# Patient Record
Sex: Female | Born: 1990 | Race: White | Hispanic: No | Marital: Single | Smoking: Current every day smoker
Health system: Southern US, Community
[De-identification: ages and names within clinical notes are randomized; demographics above are authoritative.]

## PROBLEM LIST (undated history)

## (undated) DIAGNOSIS — F99 Mental disorder, not otherwise specified: Secondary | ICD-10-CM

## (undated) DIAGNOSIS — S72302A Unspecified fracture of shaft of left femur, initial encounter for closed fracture: Secondary | ICD-10-CM

## (undated) DIAGNOSIS — F1120 Opioid dependence, uncomplicated: Secondary | ICD-10-CM

## (undated) DIAGNOSIS — E669 Obesity, unspecified: Secondary | ICD-10-CM

## (undated) DIAGNOSIS — K589 Irritable bowel syndrome without diarrhea: Secondary | ICD-10-CM

## (undated) DIAGNOSIS — F1110 Opioid abuse, uncomplicated: Secondary | ICD-10-CM

## (undated) DIAGNOSIS — D693 Immune thrombocytopenic purpura: Secondary | ICD-10-CM

## (undated) DIAGNOSIS — F1910 Other psychoactive substance abuse, uncomplicated: Secondary | ICD-10-CM

## (undated) HISTORY — DX: Immune thrombocytopenic purpura: D69.3

## (undated) HISTORY — PX: WISDOM TOOTH EXTRACTION: SHX21

## (undated) HISTORY — DX: Obesity, unspecified: E66.9

---
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
## 2010-03-16 HISTORY — PX: COLONOSCOPY: SHX174

## 2012-09-09 ENCOUNTER — Encounter (HOSPITAL_COMMUNITY): Payer: Self-pay | Admitting: *Deleted

## 2012-09-09 ENCOUNTER — Ambulatory Visit (HOSPITAL_COMMUNITY)
Admission: RE | Admit: 2012-09-09 | Discharge: 2012-09-09 | Disposition: A | Discharge status: Home / Self Care | Payer: BC Managed Care – PPO | Attending: Psychiatry | Admitting: Psychiatry

## 2012-09-09 DIAGNOSIS — F1910 Other psychoactive substance abuse, uncomplicated: Secondary | ICD-10-CM | POA: Insufficient documentation

## 2012-09-09 HISTORY — DX: Mental disorder, not otherwise specified: F99

## 2012-09-09 HISTORY — DX: Irritable bowel syndrome, unspecified: K58.9
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
## 2012-09-10 ENCOUNTER — Encounter (HOSPITAL_COMMUNITY): Payer: Self-pay | Admitting: Psychology

## 2012-09-11 ENCOUNTER — Other Ambulatory Visit (HOSPITAL_COMMUNITY): Payer: BC Managed Care – PPO | Attending: Psychiatry | Admitting: Psychology

## 2012-09-11 DIAGNOSIS — F1910 Other psychoactive substance abuse, uncomplicated: Secondary | ICD-10-CM | POA: Insufficient documentation

## 2012-09-11 DIAGNOSIS — F1120 Opioid dependence, uncomplicated: Secondary | ICD-10-CM

## 2012-09-11 DIAGNOSIS — F1320 Sedative, hypnotic or anxiolytic dependence, uncomplicated: Secondary | ICD-10-CM

## 2012-09-12 ENCOUNTER — Encounter (HOSPITAL_COMMUNITY): Payer: Self-pay | Admitting: Psychology

## 2012-09-12 DIAGNOSIS — F1120 Opioid dependence, uncomplicated: Secondary | ICD-10-CM | POA: Insufficient documentation

## 2012-09-12 DIAGNOSIS — F1320 Sedative, hypnotic or anxiolytic dependence, uncomplicated: Secondary | ICD-10-CM | POA: Insufficient documentation
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
## 2012-09-13 ENCOUNTER — Other Ambulatory Visit (HOSPITAL_COMMUNITY): Payer: BC Managed Care – PPO

## 2012-09-15 ENCOUNTER — Telehealth (HOSPITAL_COMMUNITY): Payer: Self-pay | Admitting: Psychology

## 2012-09-15 NOTE — Progress Notes (Unsigned)
Patient ID: Jodi Wilson, female   DOB: 1990/10/08, 22 y.o.   MRN: [id number] CD-IOP: The patient did not appear for group today nor did she contact me to explain her absence. She appeared on Wednesday for her first group session. At the conclusion of group, I phoned her mother and inquired about her daughter and explained her absence from group. The patient's mother reported her daughter had just come home a little earlier and reported she had gone to group. She became angry and reported she knew exactly where she was and suspected she had been using. [name] reported her daughter would be in group on Monday if she had to come with her. We will discuss this case with the medical director and co--therapist and determine what we should do with this young woman. Her absence will be recorded as unexcused.

## 2012-09-16 ENCOUNTER — Other Ambulatory Visit (HOSPITAL_COMMUNITY): Payer: BC Managed Care – PPO | Attending: Psychiatry | Admitting: Psychology

## 2012-09-16 DIAGNOSIS — F1910 Other psychoactive substance abuse, uncomplicated: Secondary | ICD-10-CM | POA: Insufficient documentation

## 2012-09-16 DIAGNOSIS — F1320 Sedative, hypnotic or anxiolytic dependence, uncomplicated: Secondary | ICD-10-CM

## 2012-09-16 DIAGNOSIS — F1120 Opioid dependence, uncomplicated: Secondary | ICD-10-CM

## 2012-09-17 ENCOUNTER — Encounter (HOSPITAL_COMMUNITY): Payer: Self-pay | Admitting: Psychology

## 2012-09-17 NOTE — Progress Notes (Signed)
    Daily Group Progress Note  Program: CD-IOP   Group Time: 1-2:30 pm  Participation Level: Minimal  Behavioral Response: Sharing  Type of Therapy: Psycho-education Group  Topic: Group Process/Mind/Body/Spirit: the group began with check-in from the weekend. Members shared what they had done to support their recovery. Also discussed were obstacles or problems that may have appeared since we last met. A handout was provided to all group members titled, "Balance of Mind, Body, and Spirit". members were asked to identify what they do to balance and feed each part of themselves. Each member came up and wrote down what they do in each area. The importance of finding balance was emphasized.   Group Time: 2:45- 4pm  Participation Level: Active  Behavioral Response: Sharing  Type of Therapy: Activity Group  Topic: Family Sculpture: the second half of group was spent in an activity called "Family Sculpture". Members were asked to sculpt their 'biological family'. this included using other group members to represent each of their family members in a scene reflective of their family life when they were children. The first sculpture proved very interesting and painful as the female member sculpted a critical, argumentative, scene with her family. There was good discussion and feedback among group members.   Summary: The patient reported she had spent the weekend with family and friends. When I asked her where she had been on Friday, she reported she hadn't felt well, but didn't tell her mother and just went to a friend's house. I reminded this patient that she had lied to her mother about a number of things and I questioned whether she had used over the weekend? The patient reported she had not used and her sobriety date remained 7/30. At the break the patient spoke to me privately and reported she had been embarrassed and didn't want to tell the group that she had taken a Xanax and Vicodin on Sunday  because she was anxious and aching from the opiate withdrawal. I insisted she share this with the group and she did after the group returned. Group members all noted that they had also suffered anxiety and discomfort in early recovery and she could expect this. The patient was instructed to contact one of the female group members and practice calling people. She also stated she was going to a meeting later tonight. She didn't drive and her mother was going to pick her up and take her. The patient's sobriety date is today - 8/4.   Family Program: Family present? No   Name of family member(s):   UDS collected: Yes Results: not yet returned, but patient admitted taking vicodin and a Xanax yesterday  AA/NA attended?: No, patient has not attended any meetings yet  Sponsor?: No   Bertrand Vowels, LCAS

## 2012-09-18 ENCOUNTER — Other Ambulatory Visit (HOSPITAL_COMMUNITY): Payer: BC Managed Care – PPO | Attending: Psychiatry | Admitting: Psychology

## 2012-09-18 DIAGNOSIS — F1120 Opioid dependence, uncomplicated: Secondary | ICD-10-CM

## 2012-09-18 DIAGNOSIS — F1320 Sedative, hypnotic or anxiolytic dependence, uncomplicated: Secondary | ICD-10-CM

## 2012-09-18 DIAGNOSIS — F1910 Other psychoactive substance abuse, uncomplicated: Secondary | ICD-10-CM | POA: Insufficient documentation

## 2012-09-19 ENCOUNTER — Encounter (HOSPITAL_COMMUNITY): Payer: Self-pay | Admitting: Psychology
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
## 2012-09-20 ENCOUNTER — Other Ambulatory Visit (HOSPITAL_COMMUNITY): Payer: BC Managed Care – PPO | Attending: Psychiatry | Admitting: Psychology

## 2012-09-20 DIAGNOSIS — F1910 Other psychoactive substance abuse, uncomplicated: Secondary | ICD-10-CM | POA: Insufficient documentation

## 2012-09-20 DIAGNOSIS — F1320 Sedative, hypnotic or anxiolytic dependence, uncomplicated: Secondary | ICD-10-CM

## 2012-09-20 DIAGNOSIS — F1120 Opioid dependence, uncomplicated: Secondary | ICD-10-CM

## 2012-09-20 DIAGNOSIS — F1220 Cannabis dependence, uncomplicated: Secondary | ICD-10-CM

## 2012-09-20 MED ORDER — HYDROXYZINE HCL 25 MG PO TABS: 25.0000 mg | ORAL_TABLET | Freq: Every evening | ORAL | Status: DC | PRN
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
## 2012-09-22 ENCOUNTER — Encounter (HOSPITAL_COMMUNITY): Payer: Self-pay | Admitting: Psychology

## 2012-09-22 DIAGNOSIS — F1220 Cannabis dependence, uncomplicated: Secondary | ICD-10-CM | POA: Insufficient documentation

## 2012-09-22 NOTE — Progress Notes (Addendum)
    Daily Group Progress Note  Program: CD-IOP   Group Time: 1-2:30 pm  Participation Level: Minimal  Behavioral Response: Sharing  Type of Therapy: Process Group  Topic: Group Process: the first part of group was spent in process. Members shared bout current issues and concerns. The newest group member vented and disclosed many of his feelings felt in the past 45 hours or since he left this group session on Wednesday. During the time the medical director met with another new member. A brief psycho-ed piece was provided on the 4-piece relapse process. There was a good discussion and feedback on disclosures in this session.   Group Time: 2:45-4pm  Participation Level: Minimal  Behavioral Response: Appropriate and Sharing  Type of Therapy: Activity Group  Topic: Family Sculpture: the second half of group was spent in an activity  that we first began on Monday. Since the first day, members have admitted being very interested in their own biological families and all wanted the opportunity to 'sculpt' their family. The session proved very revealing and as members talked and described the meanings behind these forms and it proved very therapeutic. As the session ended, members shared their plans for the upcoming weekend.   Summary: The patient reported she had attended the women's meeting yesterday and had liked it. She met with the medical director for much of this first half of group.when she returned, the patient was asked to share a little bit about herself. She admitted she had begun using at a very young age. Her drug use grew and she had actually flunked out of 3 different schools because of her drug use.  Later on, the patient did admit she had attended some meetings with her best friend, Franky Macho, who she has known for years, but with whom she has gotten high for years. Despite repeated encouragement by her fellow group members, the patient seems insistent on 'helping' him stay sober. The  patient participated in the sculpture and stood in as a family member. She appears to be getting more comfortable in the group, but has a lot to learn about recovery and triggers. Her sobriety date remains 8/3.   Family Program: Family present? No   Name of family member(s):   UDS collected: No Results:  AA/NA attended?: YesWednesday and Thursday  Sponsor?: No, but she is very new   Treyden Hakim, LCAS

## 2012-09-23 ENCOUNTER — Other Ambulatory Visit (HOSPITAL_COMMUNITY): Payer: BC Managed Care – PPO | Attending: Psychiatry | Admitting: Psychology

## 2012-09-23 DIAGNOSIS — F1220 Cannabis dependence, uncomplicated: Secondary | ICD-10-CM

## 2012-09-23 DIAGNOSIS — F1320 Sedative, hypnotic or anxiolytic dependence, uncomplicated: Secondary | ICD-10-CM

## 2012-09-23 DIAGNOSIS — F1910 Other psychoactive substance abuse, uncomplicated: Secondary | ICD-10-CM | POA: Insufficient documentation

## 2012-09-23 DIAGNOSIS — F1120 Opioid dependence, uncomplicated: Secondary | ICD-10-CM

## 2012-09-24 ENCOUNTER — Encounter (HOSPITAL_COMMUNITY): Payer: Self-pay | Admitting: Psychology

## 2012-09-24 NOTE — Progress Notes (Signed)
    Daily Group Progress Note  Program: CD-IOP   Group Time: 1-2:30 pm  Participation Level: Minimal  Behavioral Response: Sharing, Rationalizing and Minimizing  Type of Therapy: Process Group   Topic: Group Process/Pro's and Con's: the first half of this session was spent in process. Members checked-in and shared what they had done over the weekend. Two members had relapsed and the members shared about the issues that had led them to use. The group discussed possible options that may have avoided the relapse. Later on in this half of group, a psycho-ed presentation was provided asking members to identify the Pro's and Con's of addiction versus sobriety. An interesting discussion ensued, but at the end of the discussion, members agreed that the benefits outweighed the negatives.   Group Time: 2:45- 4pm  Participation Level: Minimal  Behavioral Response: Resistant  Type of Therapy: Psycho-education Group  Topic: Feelings: Why They are so Important. The second half of group was spent in a psycho-ed session on the topic of feelings. Members were provided a handout that identified why feelings are so important. Members were open about the struggles with feelings and how difficult they are in early recovery. They were asked to identify what they were feeling, but many still appear frustrated or uncertain whey they are feeling as they do. This will be an ongoing process they will deal with in early recovery.   Summary: The patient checked-in with a new sobriety date. She admitted she had had an awful weekend and had used on Saturday. When asked about the 12 step meetings she might have attended, the patient admitted she had not attended any meetings. She made many different excuses for her relapse and had nothing but complaints and excuses for her the weekend. The group provided good support and suggestions for things she could have done as well as actions she could take now. She offered little  of herself and did not contribute to the session. Her new sobriety date is 8/10.  Family Program: Family present? No   Name of family member(s):   UDS collected: Yes Results: not returned from lab  AA/NA attended?: No  Sponsor?: No   Nylen Creque, LCAS

## 2012-09-25 ENCOUNTER — Other Ambulatory Visit (HOSPITAL_COMMUNITY): Payer: BC Managed Care – PPO | Attending: Psychiatry | Admitting: Psychology

## 2012-09-25 DIAGNOSIS — F1220 Cannabis dependence, uncomplicated: Secondary | ICD-10-CM

## 2012-09-25 DIAGNOSIS — F1120 Opioid dependence, uncomplicated: Secondary | ICD-10-CM

## 2012-09-25 DIAGNOSIS — F1910 Other psychoactive substance abuse, uncomplicated: Secondary | ICD-10-CM | POA: Insufficient documentation

## 2012-09-25 DIAGNOSIS — F1320 Sedative, hypnotic or anxiolytic dependence, uncomplicated: Secondary | ICD-10-CM

## 2012-09-27 ENCOUNTER — Other Ambulatory Visit (HOSPITAL_COMMUNITY): Payer: BC Managed Care – PPO

## 2012-09-29 ENCOUNTER — Encounter (HOSPITAL_COMMUNITY): Payer: Self-pay | Admitting: Psychology

## 2012-09-29 NOTE — Progress Notes (Signed)
    Daily Group Progress Note  Program: CD-IOP   Group Time: 1-2:30 pm  Participation Level: Active  Behavioral Response: Sharing and Disruptive  Type of Therapy: Psycho-education Group  Topic: Visit with the Chaplain: after a brief check-in, the group welcomed a visit from [employer], the chaplain at the [facility]. After introductions, the chaplain invited a discussion about "Hope". She invited group members to share what they hope for, especially what they hope to gain from treatment. There was good disclosure among members. The chaplain challenged members to identify the difference between hoping and wishing? The importance of being "pro-active" or working towards hope was emphasized.     Group Time: 2:45- 4pm  Participation Level: Active  Behavioral Response: Sharing  Type of Therapy: Process Group  Topic: Matrix/Introductions: a brief presentation utilizing the Matrix treatment program was presented in the first part of this half of group. A slide show was presented that highlighted the development through of addiction through reinforcement. The way triggers and cravings first develop and then grow in strength. Two new members were present today and they were invited to tell their new group members a little bit about themselves and what they hoped to get from the group. There was good feedback and support with members inviting the new members to join them this evening at some specific 12-step meetings.    Summary: The patient reported she remained drug-free with a sobriety date of 8/10. She reported school begins next week and she plans on taking classes. She reported she had attended 2 12-step meetings since the last group session. The patient had good questions for the chaplain, including asking what a chaplain is? She also volunteered to draw her "sadness or grief" on a piece of paper with some paint provided by the chaplain. The patient moved very slowly and seemed  somewhat confused and at one point, she was asked if she was 'high'? The patient denied that she was high and reported she had not slept well last night. Near the end of the session, another group member requested thathe patient not walk around so much during the group. He described her constant movement as being 'distracting'. Other members agreed with this and the patient was somewhat defensive and teary about his disclosure and request. A UA was collected from this patient. We will continue to monitor closely.     Family Program: Family present? No   Name of family member(s):   UDS collected: Yes Results: waiting for the results  AA/NA attended?: YesMonday and Tuesday  Sponsor?: No   Eon Zunker, LCAS

## 2012-09-30 ENCOUNTER — Other Ambulatory Visit (HOSPITAL_COMMUNITY): Payer: BC Managed Care – PPO

## 2012-10-02 ENCOUNTER — Other Ambulatory Visit (HOSPITAL_COMMUNITY): Payer: BC Managed Care – PPO

## 2012-10-04 ENCOUNTER — Other Ambulatory Visit (HOSPITAL_COMMUNITY): Payer: BC Managed Care – PPO

## 2012-10-05 ENCOUNTER — Encounter (HOSPITAL_COMMUNITY): Payer: Self-pay | Admitting: Psychology
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
## 2012-10-07 ENCOUNTER — Other Ambulatory Visit (HOSPITAL_COMMUNITY): Payer: BC Managed Care – PPO

## 2012-10-09 ENCOUNTER — Other Ambulatory Visit (HOSPITAL_COMMUNITY): Payer: BC Managed Care – PPO

## 2012-10-11 ENCOUNTER — Other Ambulatory Visit (HOSPITAL_COMMUNITY): Payer: BC Managed Care – PPO

## 2012-10-16 ENCOUNTER — Other Ambulatory Visit (HOSPITAL_COMMUNITY): Payer: BC Managed Care – PPO

## 2012-10-18 ENCOUNTER — Other Ambulatory Visit (HOSPITAL_COMMUNITY): Payer: BC Managed Care – PPO

## 2012-10-21 ENCOUNTER — Other Ambulatory Visit (HOSPITAL_COMMUNITY): Payer: BC Managed Care – PPO

## 2012-10-23 ENCOUNTER — Other Ambulatory Visit (HOSPITAL_COMMUNITY): Payer: BC Managed Care – PPO

## 2012-10-25 ENCOUNTER — Other Ambulatory Visit (HOSPITAL_COMMUNITY): Payer: BC Managed Care – PPO

## 2012-10-28 ENCOUNTER — Other Ambulatory Visit (HOSPITAL_COMMUNITY): Payer: BC Managed Care – PPO

## 2012-10-30 ENCOUNTER — Other Ambulatory Visit (HOSPITAL_COMMUNITY): Payer: BC Managed Care – PPO

## 2012-11-01 ENCOUNTER — Other Ambulatory Visit (HOSPITAL_COMMUNITY): Payer: BC Managed Care – PPO

## 2012-11-04 ENCOUNTER — Other Ambulatory Visit (HOSPITAL_COMMUNITY): Payer: BC Managed Care – PPO

## 2012-11-06 ENCOUNTER — Other Ambulatory Visit (HOSPITAL_COMMUNITY): Payer: BC Managed Care – PPO

## 2012-11-08 ENCOUNTER — Other Ambulatory Visit (HOSPITAL_COMMUNITY): Payer: BC Managed Care – PPO

## 2013-07-21 ENCOUNTER — Emergency Department (HOSPITAL_COMMUNITY): Payer: BC Managed Care – PPO

## 2013-07-21 ENCOUNTER — Encounter (HOSPITAL_COMMUNITY): Payer: Self-pay | Admitting: Emergency Medicine

## 2013-07-21 ENCOUNTER — Emergency Department (HOSPITAL_COMMUNITY)
Admission: EM | Admit: 2013-07-21 | Discharge: 2013-07-21 | Disposition: A | Discharge status: Home / Self Care | Payer: BC Managed Care – PPO | Attending: Emergency Medicine | Admitting: Emergency Medicine

## 2013-07-21 DIAGNOSIS — R1110 Vomiting, unspecified: Secondary | ICD-10-CM

## 2013-07-21 DIAGNOSIS — R1032 Left lower quadrant pain: Secondary | ICD-10-CM | POA: Insufficient documentation

## 2013-07-21 DIAGNOSIS — R112 Nausea with vomiting, unspecified: Secondary | ICD-10-CM | POA: Insufficient documentation

## 2013-07-21 DIAGNOSIS — Z8659 Personal history of other mental and behavioral disorders: Secondary | ICD-10-CM | POA: Insufficient documentation

## 2013-07-21 DIAGNOSIS — Z8719 Personal history of other diseases of the digestive system: Secondary | ICD-10-CM | POA: Insufficient documentation

## 2013-07-21 DIAGNOSIS — Z87891 Personal history of nicotine dependence: Secondary | ICD-10-CM | POA: Insufficient documentation

## 2013-07-21 DIAGNOSIS — R1031 Right lower quadrant pain: Secondary | ICD-10-CM | POA: Insufficient documentation

## 2013-07-21 DIAGNOSIS — Z3202 Encounter for pregnancy test, result negative: Secondary | ICD-10-CM | POA: Insufficient documentation

## 2013-07-21 DIAGNOSIS — E669 Obesity, unspecified: Secondary | ICD-10-CM | POA: Insufficient documentation

## 2013-07-21 DIAGNOSIS — Z862 Personal history of diseases of the blood and blood-forming organs and certain disorders involving the immune mechanism: Secondary | ICD-10-CM | POA: Insufficient documentation

## 2013-07-21 DIAGNOSIS — R109 Unspecified abdominal pain: Secondary | ICD-10-CM

## 2013-07-21 DIAGNOSIS — Z79899 Other long term (current) drug therapy: Secondary | ICD-10-CM | POA: Insufficient documentation

## 2013-07-21 HISTORY — DX: Other psychoactive substance abuse, uncomplicated: F19.10

## 2013-07-21 LAB — URINALYSIS, ROUTINE W REFLEX MICROSCOPIC
Glucose, UA: NEGATIVE mg/dL
Hgb urine dipstick: NEGATIVE
Ketones, ur: 40 mg/dL — AB
Leukocytes, UA: NEGATIVE
Nitrite: NEGATIVE
Protein, ur: NEGATIVE mg/dL
Specific Gravity, Urine: 1.04 — ABNORMAL HIGH (ref 1.005–1.030)
Urobilinogen, UA: 1 mg/dL (ref 0.0–1.0)
pH: 5.5 (ref 5.0–8.0)

## 2013-07-21 LAB — COMPREHENSIVE METABOLIC PANEL
ALT: 15 U/L (ref 0–35)
AST: 24 U/L (ref 0–37)
Albumin: 4 g/dL (ref 3.5–5.2)
Alkaline Phosphatase: 57 U/L (ref 39–117)
BUN: 7 mg/dL (ref 6–23)
CO2: 23 mEq/L (ref 19–32)
Calcium: 9.7 mg/dL (ref 8.4–10.5)
Chloride: 99 mEq/L (ref 96–112)
Creatinine, Ser: 0.78 mg/dL (ref 0.50–1.10)
GFR calc Af Amer: >90 mL/min (ref >90)
GFR calc non Af Amer: >90 mL/min (ref >90)
Glucose, Bld: 108 mg/dL — ABNORMAL HIGH (ref 70–99)
Potassium: 3.7 mEq/L (ref 3.7–5.3)
Sodium: 137 mEq/L (ref 137–147)
Total Bilirubin: 0.6 mg/dL (ref 0.3–1.2)
Total Protein: 7.7 g/dL (ref 6.0–8.3)

## 2013-07-21 LAB — LIPASE, BLOOD: Lipase: 11 U/L (ref 11–59)

## 2013-07-21 LAB — CBC WITH DIFFERENTIAL/PLATELET
Basophils Absolute: 0 10*3/uL (ref 0.0–0.1)
Basophils Relative: 0 % (ref 0–1)
Eosinophils Absolute: 0.1 10*3/uL (ref 0.0–0.7)
Eosinophils Relative: 2 % (ref 0–5)
HCT: 41.3 % (ref 36.0–46.0)
Hemoglobin: 14.2 g/dL (ref 12.0–15.0)
Lymphocytes Relative: 29 % (ref 12–46)
Lymphs Abs: 1.3 10*3/uL (ref 0.7–4.0)
MCH: 31.4 pg (ref 26.0–34.0)
MCHC: 34.4 g/dL (ref 30.0–36.0)
MCV: 91.4 fL (ref 78.0–100.0)
Monocytes Absolute: 0.7 10*3/uL (ref 0.1–1.0)
Monocytes Relative: 15 % — ABNORMAL HIGH (ref 3–12)
Neutro Abs: 2.4 10*3/uL (ref 1.7–7.7)
Neutrophils Relative %: 54 % (ref 43–77)
Platelets: 129 10*3/uL — ABNORMAL LOW (ref 150–400)
RBC: 4.52 MIL/uL (ref 3.87–5.11)
RDW: 12 % (ref 11.5–15.5)
WBC: 4.5 10*3/uL (ref 4.0–10.5)

## 2013-07-21 LAB — WET PREP, GENITAL
Clue Cells Wet Prep HPF POC: NONE SEEN
Trich, Wet Prep: NONE SEEN
Yeast Wet Prep HPF POC: NONE SEEN

## 2013-07-21 LAB — POC URINE PREG, ED: Preg Test, Ur: NEGATIVE

## 2013-07-21 MED ORDER — SODIUM CHLORIDE 0.9 % IV BOLUS (SEPSIS)
1000.0000 mL | Freq: Once | INTRAVENOUS | Status: AC
  Administered 2013-07-21: 1000 mL via INTRAVENOUS

## 2013-07-21 MED ORDER — ACETAMINOPHEN 325 MG PO TABS
650.0000 mg | ORAL_TABLET | Freq: Once | ORAL | Status: AC
  Administered 2013-07-21: 650 mg via ORAL
  Filled 2013-07-21: qty 2

## 2013-07-21 MED ORDER — ONDANSETRON HCL 4 MG/2ML IJ SOLN
4.0000 mg | Freq: Once | INTRAMUSCULAR | Status: AC
  Administered 2013-07-21: 4 mg via INTRAVENOUS
  Filled 2013-07-21: qty 2

## 2013-07-21 MED ORDER — MAGNESIUM CITRATE PO SOLN: 1.0000 | Freq: Once | ORAL | Status: DC

## 2013-07-21 MED ORDER — IOHEXOL 300 MG/ML  SOLN
25.0000 mL | INTRAMUSCULAR | Status: AC
  Administered 2013-07-21 (×2): 25 mL via ORAL

## 2013-07-21 MED ORDER — PROMETHAZINE HCL 25 MG PO TABS: 25.0000 mg | ORAL_TABLET | Freq: Four times a day (QID) | ORAL | Status: DC | PRN

## 2013-07-21 MED ORDER — IOHEXOL 300 MG/ML  SOLN
80.0000 mL | Freq: Once | INTRAMUSCULAR | Status: AC | PRN
  Administered 2013-07-21: 80 mL via INTRAVENOUS

## 2013-07-21 NOTE — ED Notes (Signed)
Patient drinking sprite. Denies nausea

## 2013-07-21 NOTE — ED Notes (Signed)
CT notified patient has drank  First cup of contrast

## 2013-07-21 NOTE — ED Notes (Signed)
Pt ambulating to restroom to give urine sample.

## 2013-07-21 NOTE — ED Notes (Signed)
Family at bedside. 

## 2013-07-21 NOTE — Discharge Instructions (Signed)
Make sure to drink plenty of fluids at home. Phenergan as prescribed as needed for nausea. Take medicines titrate or/and MiraLAX to have a bowel movement. Followup with primary care Dr. Return if your symptoms are worsening.   Abdominal Pain, Women Abdominal (stomach, pelvic, or belly) pain can be caused by many things. It is important to tell your doctor:  The location of the pain.  Does it come and go or is it present all the time?  Are there things that start the pain (eating certain foods, exercise)?  Are there other symptoms associated with the pain (fever, nausea, vomiting, diarrhea)? All of this is helpful to know when trying to find the cause of the pain. CAUSES   Stomach: virus or bacteria infection, or ulcer.  Intestine: appendicitis (inflamed appendix), regional ileitis (Crohn's disease), ulcerative colitis (inflamed colon), irritable bowel syndrome, diverticulitis (inflamed diverticulum of the colon), or cancer of the stomach or intestine.  Gallbladder disease or stones in the gallbladder.  Kidney disease, kidney stones, or infection.  Pancreas infection or cancer.  Fibromyalgia (pain disorder).  Diseases of the female organs:  Uterus: fibroid (non-cancerous) tumors or infection.  Fallopian tubes: infection or tubal pregnancy.  Ovary: cysts or tumors.  Pelvic adhesions (scar tissue).  Endometriosis (uterus lining tissue growing in the pelvis and on the pelvic organs).  Pelvic congestion syndrome (female organs filling up with blood just before the menstrual period).  Pain with the menstrual period.  Pain with ovulation (producing an egg).  Pain with an IUD (intrauterine device, birth control) in the uterus.  Cancer of the female organs.  Functional pain (pain not caused by a disease, may improve without treatment).  Psychological pain.  Depression. DIAGNOSIS  Your doctor will decide the seriousness of your pain by doing an examination.  Blood  tests.  X-rays.  Ultrasound.  CT scan (computed tomography, special type of X-ray).  MRI (magnetic resonance imaging).  Cultures, for infection.  Barium enema (dye inserted in the large intestine, to better view it with X-rays).  Colonoscopy (looking in intestine with a lighted tube).  Laparoscopy (minor surgery, looking in abdomen with a lighted tube).  Major abdominal exploratory surgery (looking in abdomen with a large incision). TREATMENT  The treatment will depend on the cause of the pain.   Many cases can be observed and treated at home.  Over-the-counter medicines recommended by your caregiver.  Prescription medicine.  Antibiotics, for infection.  Birth control pills, for painful periods or for ovulation pain.  Hormone treatment, for endometriosis.  Nerve blocking injections.  Physical therapy.  Antidepressants.  Counseling with a psychologist or psychiatrist.  Minor or major surgery. HOME CARE INSTRUCTIONS   Do not take laxatives, unless directed by your caregiver.  Take over-the-counter pain medicine only if ordered by your caregiver. Do not take aspirin because it can cause an upset stomach or bleeding.  Try a clear liquid diet (broth or water) as ordered by your caregiver. Slowly move to a bland diet, as tolerated, if the pain is related to the stomach or intestine.  Have a thermometer and take your temperature several times a day, and record it.  Bed rest and sleep, if it helps the pain.  Avoid sexual intercourse, if it causes pain.  Avoid stressful situations.  Keep your follow-up appointments and tests, as your caregiver orders.  If the pain does not go away with medicine or surgery, you may try:  Acupuncture.  Relaxation exercises (yoga, meditation).  Group therapy.  Counseling. SEEK  MEDICAL CARE IF:   You notice certain foods cause stomach pain.  Your home care treatment is not helping your pain.  You need stronger pain  medicine.  You want your IUD removed.  You feel faint or lightheaded.  You develop nausea and vomiting.  You develop a rash.  You are having side effects or an allergy to your medicine. SEEK IMMEDIATE MEDICAL CARE IF:   Your pain does not go away or gets worse.  You have a fever.  Your pain is felt only in portions of the abdomen. The right side could possibly be appendicitis. The left lower portion of the abdomen could be colitis or diverticulitis.  You are passing blood in your stools (bright red or black tarry stools, with or without vomiting).  You have blood in your urine.  You develop chills, with or without a fever.  You pass out. MAKE SURE YOU:   Understand these instructions.  Will watch your condition.  Will get help right away if you are not doing well or get worse. Document Released: 11/27/2006 Document Revised: 04/24/2011 Document Reviewed: 12/17/2008 [facility] Patient Information 2014 [place], [place].
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
## 2013-07-21 NOTE — ED Notes (Signed)
Pt stable. VS are wnl. Pt denies N/V. Pain 2/10. Pt's mom at bedside. Discharge instructions given. No questions identified by pt. Pt accompanied by mother home.

## 2013-07-21 NOTE — ED Notes (Addendum)
Pt reports hx of IBS and hasnt had BM since Thurs. Has been vomiting since Saturday worsening. Reports nausea and anorexia. Denies possibility of being pregnant. Reports abdomen tender in lower quadrants and pelvic area and lower back. Urine is tea colored.

## 2013-07-22 LAB — URINE CULTURE
Colony Count: NO GROWTH
Culture: NO GROWTH

## 2013-07-22 LAB — GC/CHLAMYDIA PROBE AMP
CT Probe RNA: POSITIVE — AB
GC Probe RNA: NEGATIVE

## 2013-07-24 NOTE — ED Provider Notes (Signed)
Medical screening examination/treatment/procedure(s) were performed by non-physician practitioner and as supervising physician I was immediately available for consultation/collaboration.   EKG Interpretation None       Derwood Kaplan, MD 07/24/13 8032

## 2013-07-26 ENCOUNTER — Telehealth (HOSPITAL_BASED_OUTPATIENT_CLINIC_OR_DEPARTMENT_OTHER): Payer: Self-pay | Admitting: Emergency Medicine

## 2013-07-26 NOTE — Telephone Encounter (Signed)
+  Chlamydia. Chart sent to EDP office for review. DHHS attached. 

## 2013-08-03 ENCOUNTER — Telehealth (HOSPITAL_BASED_OUTPATIENT_CLINIC_OR_DEPARTMENT_OTHER): Payer: Self-pay

## 2013-08-03 NOTE — Telephone Encounter (Signed)
Pt ID verified x 2.  Pt informed (+) chlamydia, needs additional treatment, notify partner(s) for testing and treatment and abstain from sex x 2 weeks post tx.  [facility] [place] [phone] and left on VM.

## 2015-08-18 DIAGNOSIS — Z113 Encounter for screening for infections with a predominantly sexual mode of transmission: Secondary | ICD-10-CM | POA: Diagnosis not present

## 2015-08-18 DIAGNOSIS — Z720 Tobacco use: Secondary | ICD-10-CM | POA: Diagnosis not present

## 2015-08-18 DIAGNOSIS — Z01419 Encounter for gynecological examination (general) (routine) without abnormal findings: Secondary | ICD-10-CM | POA: Diagnosis not present

## 2015-08-18 DIAGNOSIS — Z118 Encounter for screening for other infectious and parasitic diseases: Secondary | ICD-10-CM | POA: Diagnosis not present

## 2015-10-25 DIAGNOSIS — R030 Elevated blood-pressure reading, without diagnosis of hypertension: Secondary | ICD-10-CM | POA: Diagnosis not present

## 2015-10-25 DIAGNOSIS — J029 Acute pharyngitis, unspecified: Secondary | ICD-10-CM | POA: Diagnosis not present

## 2015-10-25 DIAGNOSIS — J189 Pneumonia, unspecified organism: Secondary | ICD-10-CM | POA: Diagnosis not present

## 2015-10-25 DIAGNOSIS — Z3200 Encounter for pregnancy test, result unknown: Secondary | ICD-10-CM | POA: Diagnosis not present

## 2015-10-27 DIAGNOSIS — Z8659 Personal history of other mental and behavioral disorders: Secondary | ICD-10-CM | POA: Diagnosis not present

## 2015-10-27 DIAGNOSIS — R3989 Other symptoms and signs involving the genitourinary system: Secondary | ICD-10-CM | POA: Diagnosis not present

## 2015-10-27 DIAGNOSIS — N912 Amenorrhea, unspecified: Secondary | ICD-10-CM | POA: Diagnosis not present

## 2015-10-27 DIAGNOSIS — R300 Dysuria: Secondary | ICD-10-CM | POA: Diagnosis not present

## 2015-11-09 DIAGNOSIS — Z7189 Other specified counseling: Secondary | ICD-10-CM | POA: Diagnosis not present

## 2015-11-15 DIAGNOSIS — Z30017 Encounter for initial prescription of implantable subdermal contraceptive: Secondary | ICD-10-CM | POA: Diagnosis not present

## 2015-11-15 DIAGNOSIS — Z3202 Encounter for pregnancy test, result negative: Secondary | ICD-10-CM | POA: Diagnosis not present

## 2015-11-30 DIAGNOSIS — N83202 Unspecified ovarian cyst, left side: Secondary | ICD-10-CM | POA: Diagnosis not present

## 2016-07-29 ENCOUNTER — Other Ambulatory Visit
Admission: RE | Admit: 2016-07-29 | Discharge: 2016-07-29 | Disposition: A | Discharge status: Home / Self Care | Payer: Self-pay | Source: Ambulatory Visit | Attending: Physician Assistant | Admitting: Physician Assistant

## 2016-07-29 DIAGNOSIS — E860 Dehydration: Secondary | ICD-10-CM | POA: Diagnosis not present

## 2016-07-29 DIAGNOSIS — M545 Low back pain: Secondary | ICD-10-CM | POA: Diagnosis not present

## 2016-07-29 DIAGNOSIS — R0789 Other chest pain: Secondary | ICD-10-CM | POA: Diagnosis not present

## 2016-07-29 DIAGNOSIS — R10819 Abdominal tenderness, unspecified site: Secondary | ICD-10-CM | POA: Diagnosis not present

## 2016-07-29 DIAGNOSIS — Z3202 Encounter for pregnancy test, result negative: Secondary | ICD-10-CM | POA: Diagnosis not present

## 2016-07-29 LAB — DIFFERENTIAL
Band Neutrophils: 0 %
Basophils Absolute: 0 10*3/uL (ref 0–0.1)
Basophils Relative: 0 %
Blasts: 0 %
Eosinophils Absolute: 0 10*3/uL (ref 0–0.7)
Eosinophils Relative: 0 %
Lymphocytes Relative: 18 %
Lymphs Abs: 0.9 10*3/uL — ABNORMAL LOW (ref 1.0–3.6)
Metamyelocytes Relative: 0 %
Monocytes Absolute: 0.3 10*3/uL (ref 0.2–0.9)
Monocytes Relative: 7 %
Myelocytes: 0 %
Neutro Abs: 3.7 10*3/uL (ref 1.4–6.5)
Neutrophils Relative %: 75 %
Other: 0 %
Promyelocytes Absolute: 0 %
nRBC: 0 /100 WBC

## 2016-07-29 LAB — COMPREHENSIVE METABOLIC PANEL
ALT: 21 U/L (ref 14–54)
AST: 34 U/L (ref 15–41)
Albumin: 4.3 g/dL (ref 3.5–5.0)
Alkaline Phosphatase: 64 U/L (ref 38–126)
Anion gap: 14 (ref 5–15)
BUN: 8 mg/dL (ref 6–20)
CO2: 22 mmol/L (ref 22–32)
Calcium: 9.6 mg/dL (ref 8.9–10.3)
Chloride: 100 mmol/L — ABNORMAL LOW (ref 101–111)
Creatinine, Ser: 0.81 mg/dL (ref 0.44–1.00)
GFR calc Af Amer: >60 mL/min (ref >60)
GFR calc non Af Amer: >60 mL/min (ref >60)
Glucose, Bld: 80 mg/dL (ref 65–99)
Potassium: 3.9 mmol/L (ref 3.5–5.1)
Sodium: 136 mmol/L (ref 135–145)
Total Bilirubin: 0.7 mg/dL (ref 0.3–1.2)
Total Protein: 7.6 g/dL (ref 6.5–8.1)

## 2016-07-29 LAB — CBC
HCT: 47.9 % — ABNORMAL HIGH (ref 35.0–47.0)
Hemoglobin: 16.1 g/dL — ABNORMAL HIGH (ref 12.0–16.0)
MCH: 32.4 pg (ref 26.0–34.0)
MCHC: 33.7 g/dL (ref 32.0–36.0)
MCV: 96.1 fL (ref 80.0–100.0)
Platelets: 72 10*3/uL — ABNORMAL LOW (ref 150–440)
RBC: 4.98 MIL/uL (ref 3.80–5.20)
RDW: 12.4 % (ref 11.5–14.5)
WBC: 4.9 10*3/uL (ref 3.6–11.0)

## 2016-08-01 ENCOUNTER — Encounter (HOSPITAL_COMMUNITY): Payer: Self-pay | Admitting: *Deleted

## 2016-08-01 ENCOUNTER — Emergency Department (HOSPITAL_COMMUNITY)
Admission: EM | Admit: 2016-08-01 | Discharge: 2016-08-01 | Disposition: A | Discharge status: Home / Self Care | Payer: BLUE CROSS/BLUE SHIELD | Attending: Emergency Medicine | Admitting: Emergency Medicine

## 2016-08-01 DIAGNOSIS — R1031 Right lower quadrant pain: Secondary | ICD-10-CM | POA: Insufficient documentation

## 2016-08-01 DIAGNOSIS — R109 Unspecified abdominal pain: Secondary | ICD-10-CM

## 2016-08-01 DIAGNOSIS — R1030 Lower abdominal pain, unspecified: Secondary | ICD-10-CM | POA: Diagnosis not present

## 2016-08-01 DIAGNOSIS — Z79899 Other long term (current) drug therapy: Secondary | ICD-10-CM | POA: Insufficient documentation

## 2016-08-01 DIAGNOSIS — N898 Other specified noninflammatory disorders of vagina: Secondary | ICD-10-CM | POA: Insufficient documentation

## 2016-08-01 DIAGNOSIS — F17210 Nicotine dependence, cigarettes, uncomplicated: Secondary | ICD-10-CM | POA: Insufficient documentation

## 2016-08-01 LAB — URINALYSIS, ROUTINE W REFLEX MICROSCOPIC
Bacteria, UA: NONE SEEN
Bilirubin Urine: NEGATIVE
Glucose, UA: NEGATIVE mg/dL
Hgb urine dipstick: NEGATIVE
Ketones, ur: NEGATIVE mg/dL
Leukocytes, UA: NEGATIVE
Nitrite: NEGATIVE
Protein, ur: 100 mg/dL — AB
Specific Gravity, Urine: 1.033 — ABNORMAL HIGH (ref 1.005–1.030)
pH: 5 (ref 5.0–8.0)

## 2016-08-01 LAB — COMPREHENSIVE METABOLIC PANEL
ALT: 24 U/L (ref 14–54)
AST: 26 U/L (ref 15–41)
Albumin: 3.5 g/dL (ref 3.5–5.0)
Alkaline Phosphatase: 60 U/L (ref 38–126)
Anion gap: 7 (ref 5–15)
BUN: 8 mg/dL (ref 6–20)
CO2: 26 mmol/L (ref 22–32)
Calcium: 9.2 mg/dL (ref 8.9–10.3)
Chloride: 107 mmol/L (ref 101–111)
Creatinine, Ser: 0.71 mg/dL (ref 0.44–1.00)
GFR calc Af Amer: >60 mL/min (ref >60)
GFR calc non Af Amer: >60 mL/min (ref >60)
Glucose, Bld: 113 mg/dL — ABNORMAL HIGH (ref 65–99)
Potassium: 3.4 mmol/L — ABNORMAL LOW (ref 3.5–5.1)
Sodium: 140 mmol/L (ref 135–145)
Total Bilirubin: 0.6 mg/dL (ref 0.3–1.2)
Total Protein: 6.5 g/dL (ref 6.5–8.1)

## 2016-08-01 LAB — I-STAT BETA HCG BLOOD, ED (MC, WL, AP ONLY): I-stat hCG, quantitative: <5 m[IU]/mL (ref <5)

## 2016-08-01 LAB — CBC
HCT: 40.4 % (ref 36.0–46.0)
Hemoglobin: 13.8 g/dL (ref 12.0–15.0)
MCH: 32.1 pg (ref 26.0–34.0)
MCHC: 34.2 g/dL (ref 30.0–36.0)
MCV: 94 fL (ref 78.0–100.0)
Platelets: 111 10*3/uL — ABNORMAL LOW (ref 150–400)
RBC: 4.3 MIL/uL (ref 3.87–5.11)
RDW: 12.3 % (ref 11.5–15.5)
WBC: 5.3 10*3/uL (ref 4.0–10.5)

## 2016-08-01 LAB — WET PREP, GENITAL
Clue Cells Wet Prep HPF POC: NONE SEEN
Sperm: NONE SEEN
Trich, Wet Prep: NONE SEEN
Yeast Wet Prep HPF POC: NONE SEEN

## 2016-08-01 LAB — LIPASE, BLOOD: Lipase: 21 U/L (ref 11–51)

## 2016-08-01 MED ORDER — ACETAMINOPHEN 500 MG PO TABS
1000.0000 mg | ORAL_TABLET | Freq: Once | ORAL | Status: AC
  Administered 2016-08-01: 1000 mg via ORAL
  Filled 2016-08-01: qty 2

## 2016-08-01 NOTE — ED Provider Notes (Signed)
[facility]-EMERGENCY DEPT Provider Note   CSN: [id number] Arrival date & time: 08/01/16  1130     History   Chief Complaint Chief Complaint  Patient presents with  . Abdominal Pain  . Flank Pain    HPI Jodi Wilson is a 26 y.o. female presenting with 5 day h/o R sided flank pain.   Patient states she started experiencing this pain, and she went [place]. At that time she had abdominal pain and vomiting. Her workup showed no evidence of UTI, but they saw that she had a kidney stone. Lab work returned that evening, and she got a call stating that she had enlarged colon, and decreased blood counts. She was told to follow-up in ED. Currently she reports right-sided flank pain. She denies fever, chills, chest pain, or nausea at this time. She reports pain the pain on her R back/flank is worse when she breathes deep. She otherwise denies shortness of breath. She reports some mild intermittent suprapubic abdominal pain that started Sunday. This pain is not associated with movement or food. She reports having some white discharge recently. She is sexually active and always uses a condom. She has a h/o chlamydia. She reports a decreased frequency of urination, and occasional dysuria. She has an nexplanon and does not have periods.  Patient reports she has a history of drug abuse, so she cannot take narcotic pain medicines. She denies any other drug use including marijuana. She states she does not drink alcohol. She smokes cigarettes (a pack a day). She has not taken anything for pain for her kidney stone today, she was told not to take ibuprofen by urgent care when her blood counts were abnormal. Additionally, urgent care told her that her colon was enlarged. Patient reports she has a history of IBS. She states that her last bowel movement today that was normal. However her prior BM was 5 days ago. This is not abnormal for her.  HPI  Past Medical History:  Diagnosis Date  . IBS  (irritable bowel syndrome)   . 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 Mental disorder   . Obesity   . Substance abuse     Patient Active Problem List   Diagnosis Date Noted  . Cannabis dependence (HCC) 09/22/2012  . Opioid dependence (HCC) 09/12/2012  . 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 COLONOSCOPY  Feb 2012  . WISDOM TOOTH EXTRACTION  Age 26    OB History    No data available       Home Medications    Prior to Admission medications   Medication Sig Start Date End Date Taking? Authorizing Provider  etonogestrel (NEXPLANON) 68 MG IMPL implant 1 each by Subdermal route once.   Yes [provider]  lamoTRIgine (LAMICTAL) 150 MG tablet Take 150 mg by mouth daily.   Yes [provider]  magnesium citrate SOLN Take 296 mLs (1 Bottle total) by mouth once. Patient not taking: Reported on 08/01/2016 07/21/13   Jaynie CrumbleKirichenko, Tatyana, PA-C  norgestimate-ethinyl estradiol (ORTHO-CYCLEN,SPRINTEC,PREVIFEM) 0.25-35 MG-MCG tablet Take 1 tablet by mouth daily.    [provider]  promethazine (PHENERGAN) 25 MG tablet Take 1 tablet (25 mg total) by mouth every 6 (six) hours as needed for nausea or vomiting. Patient not taking: Reported on 08/01/2016 07/21/13   Jaynie CrumbleKirichenko, Tatyana, PA-C    Family History Family History  Problem Relation Age of Onset  .  Mental illness Paternal Uncle   . Drug abuse Father     Social History Social History  Substance Use Topics  . Smoking status: Former Games developer  . Smokeless tobacco: Not on file  . Alcohol use Not on file     Allergies   Patient has no known allergies.   Review of Systems Review of Systems  Constitutional: Negative for chills and fever.  HENT: Negative for congestion, sinus pain, sinus pressure and sore throat.   Eyes: Negative for photophobia and visual disturbance.  Respiratory: Negative for cough, chest tightness and shortness  of breath.   Cardiovascular: Negative for chest pain.  Gastrointestinal: Positive for abdominal pain and nausea (resolved). Negative for blood in stool, constipation, diarrhea and vomiting.  Genitourinary: Positive for dysuria, flank pain, hematuria and vaginal discharge. Negative for frequency.  Musculoskeletal: Negative for back pain and myalgias.  Skin: Negative for rash.  Neurological: Negative for dizziness, weakness and headaches.  Psychiatric/Behavioral: Negative for agitation and confusion.     Physical Exam Updated Vital Signs BP (!) 129/91 (BP Location: Left Arm)   Pulse 73   Temp 98.3 F (36.8 C) (Oral)   Resp 18   LMP 07/18/2016   SpO2 98%   Physical Exam  Constitutional: She is oriented to person, place, and time. She appears well-developed and well-nourished. No distress.  HENT:  Head: Normocephalic and atraumatic.  Eyes: Conjunctivae and EOM are normal. Pupils are equal, round, and reactive to light.  Neck: Normal range of motion. Neck supple.  Cardiovascular: Normal rate, regular rhythm, normal heart sounds and intact distal pulses.   Pulmonary/Chest: Effort normal and breath sounds normal. No respiratory distress. She has no wheezes.  Abdominal: Soft. Bowel sounds are normal. She exhibits no distension. There is tenderness in the suprapubic area. There is CVA tenderness. There is no rigidity, no rebound, no guarding, no tenderness at McBurney's point and negative Murphy's sign.  Patient changes where her TTP of the right sided flank pain is. Occasionally flank pain appears to be behind lower ribs, and occasionally she reports it is further down.   Genitourinary: Rectum normal and uterus normal. Pelvic exam was performed with patient supine. There is no rash, tenderness, lesion or injury on the right labia. There is no rash, tenderness, lesion or injury on the left labia. Cervix exhibits discharge (minimal thin white discharge). Cervix exhibits no motion tenderness and  no friability. Right adnexum displays no mass, no tenderness and no fullness. Left adnexum displays no mass, no tenderness and no fullness. No erythema, tenderness or bleeding in the vagina. No vaginal discharge found.  Musculoskeletal: Normal range of motion.  Neurological: She is alert and oriented to person, place, and time.  Skin: Skin is warm and dry. She is not diaphoretic.  Psychiatric: Her affect is not blunt.  Nursing note and vitals reviewed.    ED Treatments / Results  Labs (all labs ordered are listed, but only abnormal results are displayed) Labs Reviewed  WET PREP, GENITAL - Abnormal; Notable for the following:       Result Value   WBC, Wet Prep HPF POC MANY (*)    All other components within normal limits  COMPREHENSIVE METABOLIC PANEL - Abnormal; Notable for the following:    Potassium 3.4 (*)    Glucose, Bld 113 (*)    All other components within normal limits  CBC - Abnormal; Notable for the following:    Platelets 111 (*)    All other components within normal limits  URINALYSIS, ROUTINE W REFLEX MICROSCOPIC - Abnormal; Notable for the following:    Color, Urine AMBER (*)    APPearance TURBID (*)    Specific Gravity, Urine 1.033 (*)    Protein, ur 100 (*)    Squamous Epithelial / LPF 6-30 (*)    All other components within normal limits  URINE CULTURE  LIPASE, BLOOD  RPR  HIV ANTIBODY (ROUTINE TESTING)  I-STAT BETA HCG BLOOD, ED ([facility], [facility], [facility] ONLY)  GC/CHLAMYDIA PROBE AMP ([place]) NOT AT [facility]    EKG  EKG Interpretation None       Radiology No results found.  Procedures Procedures (including critical care time)  Medications Ordered in ED Medications  acetaminophen (TYLENOL) tablet 1,000 mg (1,000 mg Oral Given 08/01/16 1635)     Initial Impression / Assessment and Plan / ED Course  I have reviewed the triage vital signs and the nursing notes.  Pertinent labs & imaging results that were available during my care of the patient were  reviewed by me and considered in my medical decision making (see chart for details).  Clinical Course as of Aug 02 1834  Tue Aug 01, 2016  1545 On initial assessment, patient does not appear in any distress. Patient affect slightly blunted. Patient reporting right-sided back/flank pain. Patient seems very concerned about the results urgent care called in including her large colon and her blood counts.  [[place]]  1653 Due to location of right-sided back pain, minimal concern for PE. Patient is PERC negative, so no indication for d-dimer or CT at this time. Will order chest x-ray to rule out pneumonia.  [[place]]  1707 Discussed with patient desire to get an x-ray to rule out pneumonia. Patient said she just doesn't want to get an xray right now, and that she wants to leave. Discussed importance of cxr to r/o PNA, and pt states she understands. She said that if her pain is worse or she feels worse, she will come back to get an xray.  She reports pain is improved Tylenol.  [[place]]  1827 Discussed findings with patient. Patient states that she has a primary care in [place], and she can follow-up with her PCP. Discussed pain control with Tylenol. At this time, patient does not appear to have a UTI or obvious infection, There are no red flags for the abd including no tenderness of the anterior abdomen, nausea, vomiting, or blood in stool. White count normal. Hemoglobin and hematocrit normal. Patient appears safe for discharge at this time. Discussed plan with patient and stressed importance of follow-up with primary care. Return precautions discussed. Patient agrees to plan.  [[place]]    Clinical Course User Index [[place]] Alveria Apley, PA-C     Final Clinical Impressions(s) / ED Diagnoses   Final diagnoses:  Right flank pain    New Prescriptions Discharge Medication List as of 08/01/2016  5:51 PM       Alveria Apley, PA-C 08/01/16 1836    Mancel Bale, MD 08/01/16 2322

## 2016-08-01 NOTE — ED Notes (Signed)
Pt ambulated to room, pt had no complaints while ambulating. Pt ambulated with a steady gait.

## 2016-08-01 NOTE — ED Notes (Signed)
Pelvic cart at the bedside 

## 2016-08-01 NOTE — ED Notes (Signed)
Emily, PA at the bedside . 

## 2016-08-01 NOTE — Discharge Instructions (Signed)
You may use Tylenol as needed for pain control. Do not take more than 4 g (4000 mg) in 24 hours. Follow-up with your primary care doctor in the next week to discuss need for further blood tests and possible treatment for IBS. Return to the ED if you develop fever, chills, worsening abdominal pain, increased pain with breathing, worsening cough, or new or worsening symptoms.

## 2016-08-01 NOTE — ED Notes (Signed)
ED Provider at bedside. 

## 2016-08-01 NOTE — ED Triage Notes (Signed)
Pt reports rt flank pain and dark urine. Pt states that she went to an urgent care on Saturday. Pt states that she has abdominal pain and vomiting as well. . Pt states that she was told that she has a kidney stone.  Pt was told to come here for further eval of "enlarged colon" and decreased "WBC and RBC".

## 2016-08-02 LAB — URINE CULTURE

## 2016-08-02 LAB — GC/CHLAMYDIA PROBE AMP (~~LOC~~) NOT AT ARMC
Chlamydia: NEGATIVE
Neisseria Gonorrhea: NEGATIVE

## 2016-08-02 LAB — HIV ANTIBODY (ROUTINE TESTING W REFLEX): HIV Screen 4th Generation wRfx: NONREACTIVE

## 2016-08-02 LAB — RPR: RPR Ser Ql: NONREACTIVE

## 2016-08-30 DIAGNOSIS — R87612 Low grade squamous intraepithelial lesion on cytologic smear of cervix (LGSIL): Secondary | ICD-10-CM | POA: Diagnosis not present

## 2016-08-30 DIAGNOSIS — Z6830 Body mass index (BMI) 30.0-30.9, adult: Secondary | ICD-10-CM | POA: Diagnosis not present

## 2016-08-30 DIAGNOSIS — Z124 Encounter for screening for malignant neoplasm of cervix: Secondary | ICD-10-CM | POA: Diagnosis not present

## 2016-08-30 DIAGNOSIS — Z01419 Encounter for gynecological examination (general) (routine) without abnormal findings: Secondary | ICD-10-CM | POA: Diagnosis not present

## 2016-10-19 DIAGNOSIS — Z6830 Body mass index (BMI) 30.0-30.9, adult: Secondary | ICD-10-CM | POA: Diagnosis not present

## 2016-10-19 DIAGNOSIS — B977 Papillomavirus as the cause of diseases classified elsewhere: Secondary | ICD-10-CM | POA: Diagnosis not present

## 2016-10-19 DIAGNOSIS — R87612 Low grade squamous intraepithelial lesion on cytologic smear of cervix (LGSIL): Secondary | ICD-10-CM | POA: Diagnosis not present

## 2016-11-01 DIAGNOSIS — H52223 Regular astigmatism, bilateral: Secondary | ICD-10-CM | POA: Diagnosis not present

## 2016-11-23 DIAGNOSIS — Z3046 Encounter for surveillance of implantable subdermal contraceptive: Secondary | ICD-10-CM | POA: Diagnosis not present

## 2016-11-23 DIAGNOSIS — Z6832 Body mass index (BMI) 32.0-32.9, adult: Secondary | ICD-10-CM | POA: Diagnosis not present

## 2017-01-02 ENCOUNTER — Emergency Department (HOSPITAL_COMMUNITY): Payer: BLUE CROSS/BLUE SHIELD

## 2017-01-02 ENCOUNTER — Other Ambulatory Visit: Payer: Self-pay

## 2017-01-02 ENCOUNTER — Emergency Department (HOSPITAL_COMMUNITY): Payer: BLUE CROSS/BLUE SHIELD | Admitting: Anesthesiology

## 2017-01-02 ENCOUNTER — Inpatient Hospital Stay (HOSPITAL_COMMUNITY): Payer: BLUE CROSS/BLUE SHIELD

## 2017-01-02 ENCOUNTER — Encounter (HOSPITAL_COMMUNITY): Admission: EM | Disposition: A | Discharge status: Home / Self Care | Payer: Self-pay | Source: Home / Self Care

## 2017-01-02 ENCOUNTER — Encounter (HOSPITAL_COMMUNITY): Payer: Self-pay | Admitting: Emergency Medicine

## 2017-01-02 ENCOUNTER — Inpatient Hospital Stay (HOSPITAL_COMMUNITY)
Admission: EM | Admit: 2017-01-02 | Discharge: 2017-01-06 | DRG: 956 | Disposition: A | Discharge status: Home / Self Care | Payer: BLUE CROSS/BLUE SHIELD | Attending: General Surgery | Admitting: General Surgery

## 2017-01-02 DIAGNOSIS — S301XXA Contusion of abdominal wall, initial encounter: Secondary | ICD-10-CM | POA: Diagnosis present

## 2017-01-02 DIAGNOSIS — F17210 Nicotine dependence, cigarettes, uncomplicated: Secondary | ICD-10-CM | POA: Diagnosis present

## 2017-01-02 DIAGNOSIS — S36039A Unspecified laceration of spleen, initial encounter: Secondary | ICD-10-CM | POA: Diagnosis not present

## 2017-01-02 DIAGNOSIS — R079 Chest pain, unspecified: Secondary | ICD-10-CM | POA: Diagnosis not present

## 2017-01-02 DIAGNOSIS — Y92410 Unspecified street and highway as the place of occurrence of the external cause: Secondary | ICD-10-CM | POA: Diagnosis not present

## 2017-01-02 DIAGNOSIS — D62 Acute posthemorrhagic anemia: Secondary | ICD-10-CM | POA: Diagnosis present

## 2017-01-02 DIAGNOSIS — S72352A Displaced comminuted fracture of shaft of left femur, initial encounter for closed fracture: Secondary | ICD-10-CM | POA: Diagnosis not present

## 2017-01-02 DIAGNOSIS — S3993XA Unspecified injury of pelvis, initial encounter: Secondary | ICD-10-CM | POA: Diagnosis not present

## 2017-01-02 DIAGNOSIS — F1110 Opioid abuse, uncomplicated: Secondary | ICD-10-CM | POA: Diagnosis present

## 2017-01-02 DIAGNOSIS — F1120 Opioid dependence, uncomplicated: Secondary | ICD-10-CM | POA: Diagnosis present

## 2017-01-02 DIAGNOSIS — S3991XA Unspecified injury of abdomen, initial encounter: Secondary | ICD-10-CM | POA: Diagnosis not present

## 2017-01-02 DIAGNOSIS — S728X2A Other fracture of left femur, initial encounter for closed fracture: Secondary | ICD-10-CM | POA: Diagnosis not present

## 2017-01-02 DIAGNOSIS — S2001XA Contusion of right breast, initial encounter: Secondary | ICD-10-CM | POA: Diagnosis present

## 2017-01-02 DIAGNOSIS — S72302A Unspecified fracture of shaft of left femur, initial encounter for closed fracture: Secondary | ICD-10-CM | POA: Diagnosis present

## 2017-01-02 DIAGNOSIS — T148XXA Other injury of unspecified body region, initial encounter: Secondary | ICD-10-CM

## 2017-01-02 DIAGNOSIS — Z419 Encounter for procedure for purposes other than remedying health state, unspecified: Secondary | ICD-10-CM

## 2017-01-02 DIAGNOSIS — S79922A Unspecified injury of left thigh, initial encounter: Secondary | ICD-10-CM | POA: Diagnosis not present

## 2017-01-02 DIAGNOSIS — S8992XA Unspecified injury of left lower leg, initial encounter: Secondary | ICD-10-CM | POA: Diagnosis not present

## 2017-01-02 DIAGNOSIS — S99911A Unspecified injury of right ankle, initial encounter: Secondary | ICD-10-CM | POA: Diagnosis not present

## 2017-01-02 DIAGNOSIS — F1910 Other psychoactive substance abuse, uncomplicated: Secondary | ICD-10-CM | POA: Diagnosis present

## 2017-01-02 DIAGNOSIS — S0990XA Unspecified injury of head, initial encounter: Secondary | ICD-10-CM | POA: Diagnosis not present

## 2017-01-02 DIAGNOSIS — S2002XA Contusion of left breast, initial encounter: Secondary | ICD-10-CM | POA: Diagnosis present

## 2017-01-02 DIAGNOSIS — S9031XA Contusion of right foot, initial encounter: Secondary | ICD-10-CM | POA: Diagnosis not present

## 2017-01-02 DIAGNOSIS — R609 Edema, unspecified: Secondary | ICD-10-CM

## 2017-01-02 DIAGNOSIS — S36031A Moderate laceration of spleen, initial encounter: Secondary | ICD-10-CM | POA: Diagnosis present

## 2017-01-02 DIAGNOSIS — S199XXA Unspecified injury of neck, initial encounter: Secondary | ICD-10-CM | POA: Diagnosis not present

## 2017-01-02 HISTORY — DX: Opioid dependence, uncomplicated: F11.20

## 2017-01-02 HISTORY — DX: Opioid abuse, uncomplicated: F11.10

## 2017-01-02 HISTORY — DX: Unspecified fracture of shaft of left femur, initial encounter for closed fracture: S72.302A

## 2017-01-02 HISTORY — PX: FEMUR IM NAIL: SHX1597

## 2017-01-02 LAB — I-STAT CHEM 8, ED
BUN: 8 mg/dL (ref 6–20)
Calcium, Ion: 1.11 mmol/L — ABNORMAL LOW (ref 1.15–1.40)
Chloride: 99 mmol/L — ABNORMAL LOW (ref 101–111)
Creatinine, Ser: 0.7 mg/dL (ref 0.44–1.00)
Glucose, Bld: 142 mg/dL — ABNORMAL HIGH (ref 65–99)
HCT: 42 % (ref 36.0–46.0)
Hemoglobin: 14.3 g/dL (ref 12.0–15.0)
Potassium: 3.2 mmol/L — ABNORMAL LOW (ref 3.5–5.1)
Sodium: 140 mmol/L (ref 135–145)
TCO2: 29 mmol/L (ref 22–32)

## 2017-01-02 LAB — RAPID URINE DRUG SCREEN, HOSP PERFORMED
Amphetamines: NOT DETECTED
Barbiturates: NOT DETECTED
Benzodiazepines: POSITIVE — AB
Cocaine: NOT DETECTED
Opiates: POSITIVE — AB
Tetrahydrocannabinol: NOT DETECTED

## 2017-01-02 LAB — URINALYSIS, ROUTINE W REFLEX MICROSCOPIC
Bilirubin Urine: NEGATIVE
Glucose, UA: NEGATIVE mg/dL
Ketones, ur: NEGATIVE mg/dL
Leukocytes, UA: NEGATIVE
Nitrite: NEGATIVE
Protein, ur: NEGATIVE mg/dL
Specific Gravity, Urine: <1.005 — ABNORMAL LOW (ref 1.005–1.030)
pH: 7 (ref 5.0–8.0)

## 2017-01-02 LAB — URINALYSIS, MICROSCOPIC (REFLEX)

## 2017-01-02 LAB — CBC
HCT: 42.3 % (ref 36.0–46.0)
HCT: 33.7 % — ABNORMAL LOW (ref 36.0–46.0)
Hemoglobin: 14.2 g/dL (ref 12.0–15.0)
Hemoglobin: 11.7 g/dL — ABNORMAL LOW (ref 12.0–15.0)
MCH: 34.1 pg — ABNORMAL HIGH (ref 26.0–34.0)
MCH: 34.7 pg — ABNORMAL HIGH (ref 26.0–34.0)
MCHC: 33.6 g/dL (ref 30.0–36.0)
MCHC: 34.7 g/dL (ref 30.0–36.0)
MCV: 101.7 fL — ABNORMAL HIGH (ref 78.0–100.0)
MCV: 100 fL (ref 78.0–100.0)
Platelets: 162 10*3/uL (ref 150–400)
Platelets: 128 10*3/uL — ABNORMAL LOW (ref 150–400)
RBC: 4.16 MIL/uL (ref 3.87–5.11)
RBC: 3.37 MIL/uL — ABNORMAL LOW (ref 3.87–5.11)
RDW: 12.8 % (ref 11.5–15.5)
RDW: 12.7 % (ref 11.5–15.5)
WBC: 15.7 10*3/uL — ABNORMAL HIGH (ref 4.0–10.5)
WBC: 8 10*3/uL (ref 4.0–10.5)

## 2017-01-02 LAB — I-STAT CG4 LACTIC ACID, ED: Lactic Acid, Venous: 1.96 mmol/L — ABNORMAL HIGH (ref 0.5–1.9)

## 2017-01-02 LAB — COMPREHENSIVE METABOLIC PANEL
ALT: 162 U/L — ABNORMAL HIGH (ref 14–54)
AST: 193 U/L — ABNORMAL HIGH (ref 15–41)
Albumin: 3.7 g/dL (ref 3.5–5.0)
Alkaline Phosphatase: 100 U/L (ref 38–126)
Anion gap: 10 (ref 5–15)
BUN: 7 mg/dL (ref 6–20)
CO2: 28 mmol/L (ref 22–32)
Calcium: 9.1 mg/dL (ref 8.9–10.3)
Chloride: 101 mmol/L (ref 101–111)
Creatinine, Ser: 0.82 mg/dL (ref 0.44–1.00)
GFR calc Af Amer: >60 mL/min (ref >60)
GFR calc non Af Amer: >60 mL/min (ref >60)
Glucose, Bld: 142 mg/dL — ABNORMAL HIGH (ref 65–99)
Potassium: 3.3 mmol/L — ABNORMAL LOW (ref 3.5–5.1)
Sodium: 139 mmol/L (ref 135–145)
Total Bilirubin: 0.9 mg/dL (ref 0.3–1.2)
Total Protein: 6.6 g/dL (ref 6.5–8.1)

## 2017-01-02 LAB — PROTIME-INR
INR: 0.96
Prothrombin Time: 12.7 seconds (ref 11.4–15.2)

## 2017-01-02 LAB — I-STAT BETA HCG BLOOD, ED (MC, WL, AP ONLY): I-stat hCG, quantitative: <5 m[IU]/mL (ref <5)

## 2017-01-02 LAB — SAMPLE TO BLOOD BANK

## 2017-01-02 LAB — CDS SEROLOGY

## 2017-01-02 LAB — ETHANOL: Alcohol, Ethyl (B): <10 mg/dL (ref <10)

## 2017-01-02 SURGERY — INSERTION, INTRAMEDULLARY ROD, FEMUR
Anesthesia: General | Laterality: Left

## 2017-01-02 MED ORDER — ONDANSETRON HCL 4 MG/2ML IJ SOLN: 4.0000 mg | Freq: Four times a day (QID) | INTRAMUSCULAR | Status: DC | PRN

## 2017-01-02 MED ORDER — DIPHENHYDRAMINE HCL 50 MG/ML IJ SOLN: 12.5000 mg | Freq: Four times a day (QID) | INTRAMUSCULAR | Status: DC | PRN

## 2017-01-02 MED ORDER — PANTOPRAZOLE SODIUM 40 MG IV SOLR: 40.0000 mg | Freq: Every day | INTRAVENOUS | Status: DC

## 2017-01-02 MED ORDER — METHOCARBAMOL 500 MG PO TABS
750.0000 mg | ORAL_TABLET | Freq: Four times a day (QID) | ORAL | Status: DC
  Administered 2017-01-02 – 2017-01-06 (×16): 750 mg via ORAL
  Filled 2017-01-02 (×2): qty 2
  Filled 2017-01-02: qty 1.5
  Filled 2017-01-02 (×2): qty 2
  Filled 2017-01-02: qty 1.5
  Filled 2017-01-02: qty 2
  Filled 2017-01-02: qty 1.5
  Filled 2017-01-02 (×8): qty 2

## 2017-01-02 MED ORDER — LIDOCAINE HCL (CARDIAC) 20 MG/ML IV SOLN
INTRAVENOUS | Status: DC | PRN
  Administered 2017-01-02: 80 mg via INTRAVENOUS

## 2017-01-02 MED ORDER — PHENYLEPHRINE HCL 10 MG/ML IJ SOLN
INTRAVENOUS | Status: DC | PRN
  Administered 2017-01-02: 40 ug/min via INTRAVENOUS

## 2017-01-02 MED ORDER — ACETAMINOPHEN 10 MG/ML IV SOLN
1000.0000 mg | Freq: Four times a day (QID) | INTRAVENOUS | Status: AC
  Administered 2017-01-02 – 2017-01-04 (×7): 1000 mg via INTRAVENOUS
  Filled 2017-01-02 (×7): qty 100

## 2017-01-02 MED ORDER — FENTANYL CITRATE (PF) 100 MCG/2ML IJ SOLN
INTRAMUSCULAR | Status: DC | PRN
  Administered 2017-01-02: 150 ug via INTRAVENOUS

## 2017-01-02 MED ORDER — CEFAZOLIN SODIUM-DEXTROSE 1-4 GM/50ML-% IV SOLN
1.0000 g | Freq: Three times a day (TID) | INTRAVENOUS | Status: AC
  Administered 2017-01-02 – 2017-01-03 (×3): 1 g via INTRAVENOUS
  Filled 2017-01-02 (×3): qty 50

## 2017-01-02 MED ORDER — HYDROMORPHONE HCL 1 MG/ML IJ SOLN
INTRAMUSCULAR | Status: AC
  Filled 2017-01-02: qty 1

## 2017-01-02 MED ORDER — FENTANYL CITRATE (PF) 100 MCG/2ML IJ SOLN: 25.0000 ug | INTRAMUSCULAR | Status: DC | PRN

## 2017-01-02 MED ORDER — DEXAMETHASONE SODIUM PHOSPHATE 10 MG/ML IJ SOLN
INTRAMUSCULAR | Status: DC | PRN
  Administered 2017-01-02: 5 mg via INTRAVENOUS

## 2017-01-02 MED ORDER — MIDAZOLAM HCL 2 MG/2ML IJ SOLN
INTRAMUSCULAR | Status: AC
  Filled 2017-01-02: qty 2

## 2017-01-02 MED ORDER — FENTANYL CITRATE (PF) 250 MCG/5ML IJ SOLN
INTRAMUSCULAR | Status: AC
  Filled 2017-01-02: qty 5

## 2017-01-02 MED ORDER — HYDROMORPHONE HCL 1 MG/ML IJ SOLN
1.0000 mg | INTRAMUSCULAR | Status: AC | PRN
  Administered 2017-01-02 (×2): 1 mg via INTRAVENOUS

## 2017-01-02 MED ORDER — MIDAZOLAM HCL 5 MG/5ML IJ SOLN
INTRAMUSCULAR | Status: DC | PRN
  Administered 2017-01-02: 2 mg via INTRAVENOUS

## 2017-01-02 MED ORDER — KETOROLAC TROMETHAMINE 30 MG/ML IJ SOLN
INTRAMUSCULAR | Status: DC | PRN
  Administered 2017-01-02: 30 mg via INTRAVENOUS

## 2017-01-02 MED ORDER — SCOPOLAMINE 1 MG/3DAYS TD PT72
MEDICATED_PATCH | TRANSDERMAL | Status: DC | PRN
  Administered 2017-01-02: 1 via TRANSDERMAL

## 2017-01-02 MED ORDER — HYDRALAZINE HCL 20 MG/ML IJ SOLN: 10.0000 mg | INTRAMUSCULAR | Status: DC | PRN

## 2017-01-02 MED ORDER — CEFAZOLIN SODIUM-DEXTROSE 2-4 GM/100ML-% IV SOLN
INTRAVENOUS | Status: AC
Start: 2017-01-02 — End: 2017-01-02
  Filled 2017-01-02: qty 100

## 2017-01-02 MED ORDER — KETOROLAC TROMETHAMINE 15 MG/ML IJ SOLN
15.0000 mg | Freq: Four times a day (QID) | INTRAMUSCULAR | Status: DC
  Administered 2017-01-02 – 2017-01-04 (×7): 15 mg via INTRAVENOUS
  Filled 2017-01-02 (×7): qty 1

## 2017-01-02 MED ORDER — CEFAZOLIN SODIUM-DEXTROSE 2-4 GM/100ML-% IV SOLN
2.0000 g | INTRAVENOUS | Status: AC
  Administered 2017-01-02: 2 g via INTRAVENOUS

## 2017-01-02 MED ORDER — HYDROMORPHONE HCL 1 MG/ML IJ SOLN
INTRAMUSCULAR | Status: AC
  Administered 2017-01-02: 1 mg via INTRAVENOUS
  Filled 2017-01-02: qty 1

## 2017-01-02 MED ORDER — DEXMEDETOMIDINE HCL IN NACL 200 MCG/50ML IV SOLN
INTRAVENOUS | Status: DC | PRN
  Administered 2017-01-02: .2 ug/kg/h via INTRAVENOUS

## 2017-01-02 MED ORDER — NALOXONE HCL 0.4 MG/ML IJ SOLN: 0.4000 mg | INTRAMUSCULAR | Status: DC | PRN

## 2017-01-02 MED ORDER — ONDANSETRON HCL 4 MG/2ML IJ SOLN
INTRAMUSCULAR | Status: DC | PRN
  Administered 2017-01-02: 4 mg via INTRAVENOUS

## 2017-01-02 MED ORDER — ROCURONIUM BROMIDE 100 MG/10ML IV SOLN
INTRAVENOUS | Status: DC | PRN
  Administered 2017-01-02: 10 mg via INTRAVENOUS
  Administered 2017-01-02: 50 mg via INTRAVENOUS
  Administered 2017-01-02: 10 mg via INTRAVENOUS

## 2017-01-02 MED ORDER — PANTOPRAZOLE SODIUM 40 MG PO TBEC
40.0000 mg | DELAYED_RELEASE_TABLET | Freq: Every day | ORAL | Status: DC
  Administered 2017-01-02 – 2017-01-06 (×5): 40 mg via ORAL
  Filled 2017-01-02 (×5): qty 1

## 2017-01-02 MED ORDER — ONDANSETRON HCL 4 MG/2ML IJ SOLN: 4.0000 mg | Freq: Once | INTRAMUSCULAR | Status: DC | PRN

## 2017-01-02 MED ORDER — 0.9 % SODIUM CHLORIDE (POUR BTL) OPTIME
TOPICAL | Status: DC | PRN
  Administered 2017-01-02: 1000 mL

## 2017-01-02 MED ORDER — SODIUM CHLORIDE 0.9% FLUSH: 9.0000 mL | INTRAVENOUS | Status: DC | PRN

## 2017-01-02 MED ORDER — ACETAMINOPHEN 10 MG/ML IV SOLN
INTRAVENOUS | Status: AC
  Filled 2017-01-02: qty 100

## 2017-01-02 MED ORDER — HYDROMORPHONE 1 MG/ML IV SOLN
INTRAVENOUS | Status: AC
  Filled 2017-01-02: qty 25

## 2017-01-02 MED ORDER — PROPOFOL 10 MG/ML IV BOLUS
INTRAVENOUS | Status: DC | PRN
Start: 2017-01-02 — End: 2017-01-02
  Administered 2017-01-02: 200 mg via INTRAVENOUS

## 2017-01-02 MED ORDER — DEXMEDETOMIDINE HCL IN NACL 200 MCG/50ML IV SOLN
INTRAVENOUS | Status: AC
  Filled 2017-01-02: qty 50

## 2017-01-02 MED ORDER — KETAMINE HCL 10 MG/ML IJ SOLN
INTRAMUSCULAR | Status: DC | PRN
  Administered 2017-01-02 (×4): 10 mg via INTRAVENOUS

## 2017-01-02 MED ORDER — IOPAMIDOL (ISOVUE-300) INJECTION 61%
INTRAVENOUS | Status: AC
  Administered 2017-01-02: 100 mL
  Filled 2017-01-02: qty 100

## 2017-01-02 MED ORDER — SUCCINYLCHOLINE CHLORIDE 200 MG/10ML IV SOSY
PREFILLED_SYRINGE | INTRAVENOUS | Status: DC | PRN
  Administered 2017-01-02: 60 mg via INTRAVENOUS

## 2017-01-02 MED ORDER — HYDROMORPHONE 1 MG/ML IV SOLN
INTRAVENOUS | Status: DC
  Administered 2017-01-02: 3.6 mg via INTRAVENOUS
  Administered 2017-01-02: 16:00:00 via INTRAVENOUS
  Administered 2017-01-03: 2.1 mg via INTRAVENOUS
  Administered 2017-01-03: 0.9 mg via INTRAVENOUS
  Administered 2017-01-03: 3.3 mg via INTRAVENOUS
  Administered 2017-01-03: 2.1 mg via INTRAVENOUS
  Administered 2017-01-03: 2.4 mg via INTRAVENOUS
  Administered 2017-01-03 (×2): 2.7 mg via INTRAVENOUS
  Administered 2017-01-04: 03:00:00 via INTRAVENOUS
  Administered 2017-01-04: 1.8 mg via INTRAVENOUS
  Filled 2017-01-02: qty 25

## 2017-01-02 MED ORDER — KETAMINE HCL-SODIUM CHLORIDE 100-0.9 MG/10ML-% IV SOSY
PREFILLED_SYRINGE | INTRAVENOUS | Status: AC
  Filled 2017-01-02: qty 10

## 2017-01-02 MED ORDER — ACETAMINOPHEN 10 MG/ML IV SOLN
INTRAVENOUS | Status: DC | PRN
  Administered 2017-01-02: 1000 mg via INTRAVENOUS

## 2017-01-02 MED ORDER — LACTATED RINGERS IV SOLN
INTRAVENOUS | Status: DC | PRN
  Administered 2017-01-02 (×2): via INTRAVENOUS

## 2017-01-02 MED ORDER — DIPHENHYDRAMINE HCL 12.5 MG/5ML PO ELIX: 12.5000 mg | ORAL_SOLUTION | Freq: Four times a day (QID) | ORAL | Status: DC | PRN

## 2017-01-02 MED ORDER — ONDANSETRON 4 MG PO TBDP: 4.0000 mg | ORAL_TABLET | Freq: Four times a day (QID) | ORAL | Status: DC | PRN

## 2017-01-02 MED ORDER — PROMETHAZINE HCL 25 MG/ML IJ SOLN: 6.2500 mg | INTRAMUSCULAR | Status: DC | PRN

## 2017-01-02 MED ORDER — KCL IN DEXTROSE-NACL 20-5-0.45 MEQ/L-%-% IV SOLN
INTRAVENOUS | Status: DC
  Administered 2017-01-02 – 2017-01-04 (×4): via INTRAVENOUS
  Filled 2017-01-02 (×4): qty 1000

## 2017-01-02 MED ORDER — SUGAMMADEX SODIUM 200 MG/2ML IV SOLN
INTRAVENOUS | Status: DC | PRN
  Administered 2017-01-02: 200 mg via INTRAVENOUS

## 2017-01-02 SURGICAL SUPPLY — 53 items
BANDAGE ELASTIC 6 VELCRO ST LF (GAUZE/BANDAGES/DRESSINGS) ×2 IMPLANT
BIT DRILL 4.2 (DRILL) ×1 IMPLANT
BNDG COHESIVE 6X5 TAN STRL LF (GAUZE/BANDAGES/DRESSINGS) ×4 IMPLANT
BRUSH SCRUB SURG 4.25 DISP (MISCELLANEOUS) ×4 IMPLANT
CATH FOLEY 2WAY SLVR 30CC 16FR (CATHETERS) ×2 IMPLANT
COVER PERINEAL POST (MISCELLANEOUS) IMPLANT
COVER SURGICAL LIGHT HANDLE (MISCELLANEOUS) ×2 IMPLANT
DRAPE C-ARMOR (DRAPES) ×2 IMPLANT
DRAPE HALF SHEET 40X57 (DRAPES) ×4 IMPLANT
DRAPE ORTHO SPLIT 77X108 STRL (DRAPES) ×3
DRAPE SURG ORHT 6 SPLT 77X108 (DRAPES) ×3 IMPLANT
DRAPE U-SHAPE 47X51 STRL (DRAPES) ×2 IMPLANT
DRILL 4.2 (DRILL) ×2
DRSG MEPILEX BORDER 4X4 (GAUZE/BANDAGES/DRESSINGS) ×2 IMPLANT
DRSG MEPILEX BORDER 4X8 (GAUZE/BANDAGES/DRESSINGS) ×2 IMPLANT
ELECT REM PT RETURN 9FT ADLT (ELECTROSURGICAL) ×2
ELECTRODE REM PT RTRN 9FT ADLT (ELECTROSURGICAL) ×1 IMPLANT
GAUZE SPONGE 4X4 12PLY STRL LF (GAUZE/BANDAGES/DRESSINGS) ×2 IMPLANT
GLOVE BIO SURGEON STRL SZ7.5 (GLOVE) ×4 IMPLANT
GLOVE BIO SURGEON STRL SZ8 (GLOVE) ×4 IMPLANT
GLOVE BIOGEL PI IND STRL 7.5 (GLOVE) ×2 IMPLANT
GLOVE BIOGEL PI IND STRL 8 (GLOVE) ×2 IMPLANT
GLOVE BIOGEL PI INDICATOR 7.5 (GLOVE) ×2
GLOVE BIOGEL PI INDICATOR 8 (GLOVE) ×2
GOWN STRL REUS W/ TWL LRG LVL3 (GOWN DISPOSABLE) ×3 IMPLANT
GOWN STRL REUS W/ TWL XL LVL3 (GOWN DISPOSABLE) ×1 IMPLANT
GOWN STRL REUS W/TWL LRG LVL3 (GOWN DISPOSABLE) ×3
GOWN STRL REUS W/TWL XL LVL3 (GOWN DISPOSABLE) ×1
KIT BASIN OR (CUSTOM PROCEDURE TRAY) ×2 IMPLANT
KIT ROOM TURNOVER OR (KITS) ×2 IMPLANT
MANIFOLD NEPTUNE II (INSTRUMENTS) IMPLANT
NAIL CANN FRN 10X340MM LEFT (Nail) ×2 IMPLANT
NS IRRIG 1000ML POUR BTL (IV SOLUTION) ×2 IMPLANT
PACK GENERAL/GYN (CUSTOM PROCEDURE TRAY) ×2 IMPLANT
PAD ABD 8X10 STRL (GAUZE/BANDAGES/DRESSINGS) ×2 IMPLANT
PAD ARMBOARD 7.5X6 YLW CONV (MISCELLANEOUS) ×2 IMPLANT
REAMER ROD DEEP FLUTE 2.5X950 (INSTRUMENTS) ×2 IMPLANT
SCREW CANN 6.5X70MM T25 STARDR (Screw) ×2 IMPLANT
SCREW LOCKING 5.0MMX40MM (Screw) ×2 IMPLANT
SCREW LOCKING 5.0X66 (Screw) ×2 IMPLANT
SCREW RECON 6.5 X 85MM (Screw) ×2 IMPLANT
STAPLER VISISTAT 35W (STAPLE) ×2 IMPLANT
STOCKINETTE IMPERVIOUS LG (DRAPES) ×2 IMPLANT
SUT ETHILON 3 0 PS 1 (SUTURE) ×2 IMPLANT
SUT VIC AB 0 CT1 27 (SUTURE) ×1
SUT VIC AB 0 CT1 27XBRD ANBCTR (SUTURE) ×1 IMPLANT
SUT VIC AB 1 CT1 27 (SUTURE) ×1
SUT VIC AB 1 CT1 27XBRD ANBCTR (SUTURE) ×1 IMPLANT
SUT VIC AB 2-0 CT1 27 (SUTURE) ×1
SUT VIC AB 2-0 CT1 TAPERPNT 27 (SUTURE) ×1 IMPLANT
TOWEL OR 17X24 6PK STRL BLUE (TOWEL DISPOSABLE) ×2 IMPLANT
TOWEL OR 17X26 10 PK STRL BLUE (TOWEL DISPOSABLE) ×4 IMPLANT
WATER STERILE IRR 1000ML POUR (IV SOLUTION) IMPLANT

## 2017-01-02 NOTE — Anesthesia Procedure Notes (Signed)
Procedure Name: Intubation Date/Time: 01/02/2017 11:35 AM Performed by: Carney Living, CRNA Pre-anesthesia Checklist: Patient identified, Emergency Drugs available, Suction available, Patient being monitored and Timeout performed Patient Re-evaluated:Patient Re-evaluated prior to induction Oxygen Delivery Method: Circle system utilized Preoxygenation: Pre-oxygenation with 100% oxygen Induction Type: IV induction, Rapid sequence and Cricoid Pressure applied Laryngoscope Size: Mac and 4 Grade View: Grade I Tube type: Oral Tube size: 7.0 mm Number of attempts: 1 Airway Equipment and Method: Stylet Placement Confirmation: ETT inserted through vocal cords under direct vision,  positive ETCO2 and breath sounds checked- equal and bilateral Secured at: 22 cm Tube secured with: Tape Dental Injury: Teeth and Oropharynx as per pre-operative assessment

## 2017-01-02 NOTE — Progress Notes (Addendum)
[facility] at bedside reports that they will take care of patient.

## 2017-01-02 NOTE — ED Notes (Signed)
Family at beside. Family given emotional support.  Ortho MD and EDP at bedside with family.

## 2017-01-02 NOTE — ED Triage Notes (Addendum)
[facility] reporting L femur deformity, multiple abd bruises noted.  C-collar on and aligned.

## 2017-01-02 NOTE — ED Notes (Signed)
ED Provider and ortho at bedside.

## 2017-01-02 NOTE — Transfer of Care (Signed)
Immediate Anesthesia Transfer of Care Note  Patient: Jodi Wilson  Procedure(s) Performed: INTRAMEDULLARY (IM) NAIL FEMORAL LEFT (Left )  Patient Location: PACU  Anesthesia Type:General  Level of Consciousness: awake, alert , oriented and patient cooperative  Airway & Oxygen Therapy: Patient Spontanous Breathing and Patient connected to nasal cannula oxygen  Post-op Assessment: Report given to RN, Post -op Vital signs reviewed and stable and Patient moving all extremities X 4  Post vital signs: Reviewed and stable  Last Vitals:  Vitals:   01/02/17 1000 01/02/17 1448  BP: 133/89   Pulse: (!) 102   Resp: 19   Temp:  (!) (P) 36.3 C  SpO2: 98%     Last Pain:  Vitals:   01/02/17 1448  PainSc: (P) Asleep         Complications: No apparent anesthesia complications

## 2017-01-02 NOTE — ED Notes (Signed)
Pt transported to CT with this RN at bedside, monitor on.

## 2017-01-02 NOTE — Progress Notes (Signed)
Orthopedic Tech Progress Note Patient Details:  Jodi Wilson 05/05/1990 [id number]  Patient ID: Jodi Wilson, female   DOB: 02/10/1991, 26 y.o.   MRN: [id number]   Nikki DomCrawford, Moustafa Mossa 01/02/2017, 8:41 AM Made level 2 trauma visit
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
## 2017-01-02 NOTE — ED Notes (Signed)
Dr. Thompson at bedside. 
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
## 2017-01-02 NOTE — Anesthesia Preprocedure Evaluation (Addendum)
Anesthesia Evaluation  Patient identified by MRN, date of birth, ID band Patient awake    Reviewed: Allergy & Precautions, NPO status , Patient's Chart, lab work & pertinent test results  Airway Mallampati: II  TM Distance: >3 FB Neck ROM: Full    Dental  (+) Dental Advisory Given, Teeth Intact   Pulmonary Current Smoker,    Pulmonary exam normal breath sounds clear to auscultation       Cardiovascular negative cardio ROS Normal cardiovascular exam Rhythm:Regular Rate:Tachycardia     Neuro/Psych negative neurological ROS  negative psych ROS   GI/Hepatic negative GI ROS, (+)     substance abuse  alcohol use and IV drug use, Methadone for hx opiate addiction   Endo/Other  negative endocrine ROS  Renal/GU negative Renal ROS  negative genitourinary   Musculoskeletal negative musculoskeletal ROS (+)   Abdominal   Peds  Hematology negative hematology ROS (+)   Anesthesia Other Findings   Reproductive/Obstetrics                            Anesthesia Physical Anesthesia Plan  ASA: II and emergent  Anesthesia Plan: General   Post-op Pain Management:    Induction: Intravenous, Cricoid pressure planned and Rapid sequence  PONV Risk Score and Plan: 4 or greater and Treatment may vary due to age or medical condition, Scopolamine patch - Pre-op, Midazolam, Dexamethasone and Ondansetron  Airway Management Planned: Oral ETT  Additional Equipment: None  Intra-op Plan:   Post-operative Plan: Extubation in OR  Informed Consent: I have reviewed the patients History and Physical, chart, labs and discussed the procedure including the risks, benefits and alternatives for the proposed anesthesia with the patient or authorized representative who has indicated his/her understanding and acceptance.   Dental advisory given  Plan Discussed with: CRNA, Anesthesiologist and Surgeon  Anesthesia Plan  Comments: (Will utilize IV acetaminophen, low dose ketamine, ketorolac, and precedex to limit narcotic requirements)     Anesthesia Quick Evaluation
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
## 2017-01-02 NOTE — Anesthesia Postprocedure Evaluation (Signed)
Anesthesia Post Note  Patient: Jodi Wilson  Procedure(s) Performed: INTRAMEDULLARY (IM) NAIL FEMORAL LEFT (Left )     Patient location during evaluation: PACU Anesthesia Type: General Level of consciousness: awake and alert Pain management: pain level controlled Vital Signs Assessment: post-procedure vital signs reviewed and stable Respiratory status: spontaneous breathing, nonlabored ventilation, respiratory function stable and patient connected to nasal cannula oxygen Cardiovascular status: blood pressure returned to baseline and stable Postop Assessment: no apparent nausea or vomiting Anesthetic complications: no    Last Vitals:  Vitals:   01/02/17 1747 01/02/17 1800  BP: 127/86 130/77  Pulse: 95 95  Resp: 16 16  Temp:  36.9 C  SpO2: 98% 98%    Last Pain:  Vitals:   01/02/17 1745  PainSc: 7                  Beryle Lathehomas E Brogan England

## 2017-01-03 ENCOUNTER — Encounter (HOSPITAL_COMMUNITY): Payer: Self-pay | Admitting: Orthopedic Surgery

## 2017-01-03 DIAGNOSIS — S72302A Unspecified fracture of shaft of left femur, initial encounter for closed fracture: Secondary | ICD-10-CM

## 2017-01-03 DIAGNOSIS — F1120 Opioid dependence, uncomplicated: Secondary | ICD-10-CM | POA: Diagnosis present

## 2017-01-03 DIAGNOSIS — F1110 Opioid abuse, uncomplicated: Secondary | ICD-10-CM

## 2017-01-03 DIAGNOSIS — F1910 Other psychoactive substance abuse, uncomplicated: Secondary | ICD-10-CM | POA: Diagnosis present

## 2017-01-03 HISTORY — DX: Opioid dependence, uncomplicated: F11.20

## 2017-01-03 HISTORY — DX: Opioid abuse, uncomplicated: F11.10

## 2017-01-03 HISTORY — DX: Unspecified fracture of shaft of left femur, initial encounter for closed fracture: S72.302A

## 2017-01-03 LAB — BASIC METABOLIC PANEL
Anion gap: 6 (ref 5–15)
BUN: 5 mg/dL — ABNORMAL LOW (ref 6–20)
CO2: 25 mmol/L (ref 22–32)
Calcium: 7.8 mg/dL — ABNORMAL LOW (ref 8.9–10.3)
Chloride: 106 mmol/L (ref 101–111)
Creatinine, Ser: 0.54 mg/dL (ref 0.44–1.00)
GFR calc Af Amer: >60 mL/min (ref >60)
GFR calc non Af Amer: >60 mL/min (ref >60)
Glucose, Bld: 128 mg/dL — ABNORMAL HIGH (ref 65–99)
Potassium: 4 mmol/L (ref 3.5–5.1)
Sodium: 137 mmol/L (ref 135–145)

## 2017-01-03 LAB — POCT I-STAT EG7
Acid-Base Excess: 7 mmol/L — ABNORMAL HIGH (ref 0.0–2.0)
Bicarbonate: 31.2 mmol/L — ABNORMAL HIGH (ref 20.0–28.0)
Calcium, Ion: 1.09 mmol/L — ABNORMAL LOW (ref 1.15–1.40)
HCT: 35 % — ABNORMAL LOW (ref 36.0–46.0)
Hemoglobin: 11.9 g/dL — ABNORMAL LOW (ref 12.0–15.0)
O2 Saturation: 100 %
Patient temperature: 35.7
Potassium: 4 mmol/L (ref 3.5–5.1)
Sodium: 137 mmol/L (ref 135–145)
TCO2: 33 mmol/L — ABNORMAL HIGH (ref 22–32)
pCO2, Ven: 40.3 mmHg — ABNORMAL LOW (ref 44.0–60.0)
pH, Ven: 7.492 — ABNORMAL HIGH (ref 7.250–7.430)
pO2, Ven: 302 mmHg — ABNORMAL HIGH (ref 32.0–45.0)

## 2017-01-03 LAB — CBC
HCT: 26.9 % — ABNORMAL LOW (ref 36.0–46.0)
Hemoglobin: 9.1 g/dL — ABNORMAL LOW (ref 12.0–15.0)
MCH: 34.2 pg — ABNORMAL HIGH (ref 26.0–34.0)
MCHC: 33.8 g/dL (ref 30.0–36.0)
MCV: 101.1 fL — ABNORMAL HIGH (ref 78.0–100.0)
Platelets: 130 10*3/uL — ABNORMAL LOW (ref 150–400)
RBC: 2.66 MIL/uL — ABNORMAL LOW (ref 3.87–5.11)
RDW: 12.9 % (ref 11.5–15.5)
WBC: 7.7 10*3/uL (ref 4.0–10.5)

## 2017-01-03 LAB — HEPATITIS PANEL, ACUTE
HCV Ab: <0.1 s/co ratio (ref 0.0–0.9)
Hep A IgM: NEGATIVE
Hep B C IgM: POSITIVE — AB
Hepatitis B Surface Ag: NEGATIVE

## 2017-01-03 LAB — HIV ANTIBODY (ROUTINE TESTING W REFLEX): HIV Screen 4th Generation wRfx: NONREACTIVE

## 2017-01-03 MED ORDER — ADULT MULTIVITAMIN W/MINERALS CH
1.0000 | ORAL_TABLET | Freq: Every day | ORAL | Status: DC
  Administered 2017-01-03 – 2017-01-06 (×4): 1 via ORAL
  Filled 2017-01-03 (×4): qty 1

## 2017-01-03 MED ORDER — LAMOTRIGINE 100 MG PO TABS
200.0000 mg | ORAL_TABLET | Freq: Every day | ORAL | Status: DC
  Administered 2017-01-03 – 2017-01-05 (×3): 200 mg via ORAL
  Filled 2017-01-03 (×4): qty 2

## 2017-01-03 MED ORDER — FERROUS GLUCONATE 324 (38 FE) MG PO TABS
324.0000 mg | ORAL_TABLET | Freq: Two times a day (BID) | ORAL | Status: DC
  Administered 2017-01-03 – 2017-01-06 (×7): 324 mg via ORAL
  Filled 2017-01-03 (×8): qty 1
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
## 2017-01-03 NOTE — Progress Notes (Signed)
Orthopedic Tech Progress Note Patient Details:  Jodi Wilson 02/22/1990 [id number]  Ortho Devices Type of Ortho Device: (footsie roll) Ortho Device/Splint Location: lle Ortho Device/Splint Interventions: Application   Jodi Wilson 01/03/2017, 10:18 AM

## 2017-01-03 NOTE — Plan of Care (Signed)
  Education: Knowledge of General Education information will improve 01/03/2017 0322 - Progressing by Olena Materobinson, Derry Kassel G, RN Note POC reviewed with pt.

## 2017-01-03 NOTE — Progress Notes (Signed)
   01/03/17 1400  Clinical Encounter Type  Visited With Patient and family together  Visit Type Follow-up;Spiritual support;Social support  Referral From Nurse  Consult/Referral To Chaplain  Spiritual Encounters  Spiritual Needs Prayer  Stress Factors  Patient Stress Factors Exhausted;Family relationships;Health changes;Loss of control;Major life changes  Chaplain visited was initiated from a referral that came into the office.  The PT and parents were in the room.  The mother of the PT was feeling stress, shame and guilt.  She is aware of the situation that exists and the uncertainty that surrounds her daughter.  Mother needed to not be an enabler for the daughter.  Daughter came to terms and called out what she is, "an addict".  She also called out the positive things in her life in order for those things to replace the thoughts and struggles she has.  Chaplain will continue to follow up
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
## 2017-01-04 LAB — CBC
HCT: 23.2 % — ABNORMAL LOW (ref 36.0–46.0)
HCT: 23.2 % — ABNORMAL LOW (ref 36.0–46.0)
Hemoglobin: 7.8 g/dL — ABNORMAL LOW (ref 12.0–15.0)
Hemoglobin: 7.8 g/dL — ABNORMAL LOW (ref 12.0–15.0)
MCH: 34.4 pg — ABNORMAL HIGH (ref 26.0–34.0)
MCH: 34.7 pg — ABNORMAL HIGH (ref 26.0–34.0)
MCHC: 33.6 g/dL (ref 30.0–36.0)
MCHC: 33.6 g/dL (ref 30.0–36.0)
MCV: 102.2 fL — ABNORMAL HIGH (ref 78.0–100.0)
MCV: 103.1 fL — ABNORMAL HIGH (ref 78.0–100.0)
Platelets: 128 10*3/uL — ABNORMAL LOW (ref 150–400)
Platelets: 131 10*3/uL — ABNORMAL LOW (ref 150–400)
RBC: 2.27 MIL/uL — ABNORMAL LOW (ref 3.87–5.11)
RBC: 2.25 MIL/uL — ABNORMAL LOW (ref 3.87–5.11)
RDW: 13.2 % (ref 11.5–15.5)
RDW: 13 % (ref 11.5–15.5)
WBC: 7 10*3/uL (ref 4.0–10.5)
WBC: 6.9 10*3/uL (ref 4.0–10.5)

## 2017-01-04 MED ORDER — ACETAMINOPHEN 500 MG PO TABS
1000.0000 mg | ORAL_TABLET | Freq: Four times a day (QID) | ORAL | Status: DC
  Administered 2017-01-05 – 2017-01-06 (×6): 1000 mg via ORAL
  Filled 2017-01-04 (×6): qty 2

## 2017-01-04 MED ORDER — METHADONE HCL 10 MG PO TABS
60.0000 mg | ORAL_TABLET | Freq: Every day | ORAL | Status: DC
  Administered 2017-01-04 – 2017-01-06 (×3): 60 mg via ORAL
  Filled 2017-01-04 (×4): qty 6

## 2017-01-04 MED ORDER — ACETAMINOPHEN 10 MG/ML IV SOLN
1000.0000 mg | Freq: Four times a day (QID) | INTRAVENOUS | Status: AC
  Administered 2017-01-04 – 2017-01-05 (×4): 1000 mg via INTRAVENOUS
  Filled 2017-01-04 (×5): qty 100
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
## 2017-01-04 NOTE — Progress Notes (Signed)
Dilaudid PCA d/c'd. 20mL wasted, Ellie RN as witness.   Jodi Wilson, RCharity fundraiser

## 2017-01-04 NOTE — Progress Notes (Signed)
3cc Dilaudid wasted from PCA syringe with Rolland BimlerJustin O'Dell RN

## 2017-01-04 NOTE — Progress Notes (Signed)
Patient ID: Jodi Wilson, female   DOB: 09/25/1990, 26 y.o.   MRN: [id number] hct stable today, will recheck in am and if stable start therapies and oob
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
## 2017-01-05 LAB — CBC
HCT: 22.6 % — ABNORMAL LOW (ref 36.0–46.0)
Hemoglobin: 7.5 g/dL — ABNORMAL LOW (ref 12.0–15.0)
MCH: 33.8 pg (ref 26.0–34.0)
MCHC: 33.2 g/dL (ref 30.0–36.0)
MCV: 101.8 fL — ABNORMAL HIGH (ref 78.0–100.0)
Platelets: 146 10*3/uL — ABNORMAL LOW (ref 150–400)
RBC: 2.22 MIL/uL — ABNORMAL LOW (ref 3.87–5.11)
RDW: 12.8 % (ref 11.5–15.5)
WBC: 7.8 10*3/uL (ref 4.0–10.5)
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
## 2017-01-05 NOTE — Plan of Care (Signed)
  Clinical Measurements: Ability to maintain clinical measurements within normal limits will improve 01/05/2017 2320 - Progressing by Luther Redourgott, Jahari Wiginton, RN   Clinical Measurements: Will remain free from infection 01/05/2017 2320 - Progressing by Luther Redourgott, Braxen Dobek, RN   Activity: Risk for activity intolerance will decrease 01/05/2017 2320 - Progressing by Luther Redourgott, Sara Selvidge, RN
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
## 2017-01-06 LAB — CBC
HCT: 22.7 % — ABNORMAL LOW (ref 36.0–46.0)
Hemoglobin: 7.7 g/dL — ABNORMAL LOW (ref 12.0–15.0)
MCH: 34.4 pg — ABNORMAL HIGH (ref 26.0–34.0)
MCHC: 33.9 g/dL (ref 30.0–36.0)
MCV: 101.3 fL — ABNORMAL HIGH (ref 78.0–100.0)
Platelets: 176 10*3/uL (ref 150–400)
RBC: 2.24 MIL/uL — ABNORMAL LOW (ref 3.87–5.11)
RDW: 12.7 % (ref 11.5–15.5)
WBC: 8.4 10*3/uL (ref 4.0–10.5)

## 2017-01-06 MED ORDER — METHOCARBAMOL 500 MG PO TABS: 500.0000 mg | ORAL_TABLET | Freq: Four times a day (QID) | ORAL | 0 refills | Status: DC
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
## 2017-01-06 NOTE — Progress Notes (Signed)
All discharge instructions, medications and wound care explained, discussed and patient v/u. Patient Jodi Wilson removed. Per Dr. Sharlene Mottsameriez patient TED hose may be removed for 4 hours. All f/u appointment explained, reviewed and patient v/u. Patient mother arrived and all discharge instructions reviewed with mother at patients verbal request. Patient Rx provided and patient escort to front entrance.

## 2017-01-06 NOTE — Progress Notes (Signed)
Patient states she is waiting on parents to arrive. Patient states mother does not work and will be available 24/7 and boyfriend lives with her. Front wheeled walker arrived to unit. RN will continue to monitor patient.
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
## 2017-01-07 ENCOUNTER — Encounter (HOSPITAL_COMMUNITY): Payer: Self-pay | Admitting: *Deleted

## 2017-01-08 ENCOUNTER — Encounter (HOSPITAL_COMMUNITY): Payer: Self-pay | Admitting: Orthopedic Surgery
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
## 2017-01-10 ENCOUNTER — Telehealth: Payer: Self-pay | Admitting: Physician Assistant
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
## 2017-01-19 LAB — DRUG SCREEN 10 W/CONF, SERUM
Amphetamines, IA: NEGATIVE ng/mL
Barbiturates, IA: NEGATIVE ug/mL
Benzodiazepines, IA: NEGATIVE ng/mL
Cocaine & Metabolite, IA: NEGATIVE ng/mL
Methadone, IA: POSITIVE ng/mL
Opiates, IA: POSITIVE ng/mL
Oxycodones, IA: NEGATIVE ng/mL
Phencyclidine, IA: NEGATIVE ng/mL
Propoxyphene, IA: NEGATIVE ng/mL
THC(Marijuana) Metabolite, IA: NEGATIVE ng/mL

## 2017-01-24 DIAGNOSIS — S72352D Displaced comminuted fracture of shaft of left femur, subsequent encounter for closed fracture with routine healing: Secondary | ICD-10-CM | POA: Diagnosis not present

## 2017-02-19 DIAGNOSIS — S72352D Displaced comminuted fracture of shaft of left femur, subsequent encounter for closed fracture with routine healing: Secondary | ICD-10-CM | POA: Diagnosis not present

## 2017-03-06 DIAGNOSIS — Z4789 Encounter for other orthopedic aftercare: Secondary | ICD-10-CM | POA: Diagnosis not present

## 2017-03-06 DIAGNOSIS — R262 Difficulty in walking, not elsewhere classified: Secondary | ICD-10-CM | POA: Diagnosis not present

## 2017-03-06 DIAGNOSIS — M79605 Pain in left leg: Secondary | ICD-10-CM | POA: Diagnosis not present

## 2017-03-19 DIAGNOSIS — Z4789 Encounter for other orthopedic aftercare: Secondary | ICD-10-CM | POA: Diagnosis not present

## 2017-03-19 DIAGNOSIS — R262 Difficulty in walking, not elsewhere classified: Secondary | ICD-10-CM | POA: Diagnosis not present

## 2017-03-19 DIAGNOSIS — M79605 Pain in left leg: Secondary | ICD-10-CM | POA: Diagnosis not present

## 2017-03-28 DIAGNOSIS — S72352D Displaced comminuted fracture of shaft of left femur, subsequent encounter for closed fracture with routine healing: Secondary | ICD-10-CM | POA: Diagnosis not present

## 2017-04-23 DIAGNOSIS — S72352D Displaced comminuted fracture of shaft of left femur, subsequent encounter for closed fracture with routine healing: Secondary | ICD-10-CM | POA: Diagnosis not present

## 2017-04-26 DIAGNOSIS — Z1331 Encounter for screening for depression: Secondary | ICD-10-CM | POA: Diagnosis not present

## 2017-04-26 DIAGNOSIS — Z6834 Body mass index (BMI) 34.0-34.9, adult: Secondary | ICD-10-CM | POA: Diagnosis not present

## 2017-04-26 DIAGNOSIS — Z113 Encounter for screening for infections with a predominantly sexual mode of transmission: Secondary | ICD-10-CM | POA: Diagnosis not present

## 2017-04-26 DIAGNOSIS — N760 Acute vaginitis: Secondary | ICD-10-CM | POA: Diagnosis not present

## 2017-04-26 DIAGNOSIS — N898 Other specified noninflammatory disorders of vagina: Secondary | ICD-10-CM | POA: Diagnosis not present

## 2017-09-27 DIAGNOSIS — Z124 Encounter for screening for malignant neoplasm of cervix: Secondary | ICD-10-CM | POA: Diagnosis not present

## 2017-09-27 DIAGNOSIS — Z131 Encounter for screening for diabetes mellitus: Secondary | ICD-10-CM | POA: Diagnosis not present

## 2017-09-27 DIAGNOSIS — Z113 Encounter for screening for infections with a predominantly sexual mode of transmission: Secondary | ICD-10-CM | POA: Diagnosis not present

## 2017-09-27 DIAGNOSIS — Z1389 Encounter for screening for other disorder: Secondary | ICD-10-CM | POA: Diagnosis not present

## 2017-09-27 DIAGNOSIS — Z6836 Body mass index (BMI) 36.0-36.9, adult: Secondary | ICD-10-CM | POA: Diagnosis not present

## 2017-09-27 DIAGNOSIS — Z0283 Encounter for blood-alcohol and blood-drug test: Secondary | ICD-10-CM | POA: Diagnosis not present

## 2017-09-27 DIAGNOSIS — Z13220 Encounter for screening for lipoid disorders: Secondary | ICD-10-CM | POA: Diagnosis not present

## 2017-09-27 DIAGNOSIS — Z01419 Encounter for gynecological examination (general) (routine) without abnormal findings: Secondary | ICD-10-CM | POA: Diagnosis not present
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
## 2020-01-20 DIAGNOSIS — L02214 Cutaneous abscess of groin: Secondary | ICD-10-CM | POA: Diagnosis not present

## 2020-07-01 DIAGNOSIS — Z124 Encounter for screening for malignant neoplasm of cervix: Secondary | ICD-10-CM | POA: Diagnosis not present

## 2020-07-01 DIAGNOSIS — Z6834 Body mass index (BMI) 34.0-34.9, adult: Secondary | ICD-10-CM | POA: Diagnosis not present

## 2020-07-01 DIAGNOSIS — Z01419 Encounter for gynecological examination (general) (routine) without abnormal findings: Secondary | ICD-10-CM | POA: Diagnosis not present

## 2020-07-05 DIAGNOSIS — M79645 Pain in left finger(s): Secondary | ICD-10-CM | POA: Diagnosis not present

## 2020-07-05 DIAGNOSIS — Z1331 Encounter for screening for depression: Secondary | ICD-10-CM | POA: Diagnosis not present

## 2020-07-05 DIAGNOSIS — M65312 Trigger thumb, left thumb: Secondary | ICD-10-CM | POA: Diagnosis not present

## 2020-07-05 DIAGNOSIS — Z6834 Body mass index (BMI) 34.0-34.9, adult: Secondary | ICD-10-CM | POA: Diagnosis not present

## 2020-09-30 DIAGNOSIS — L500 Allergic urticaria: Secondary | ICD-10-CM | POA: Diagnosis not present

## 2022-05-23 ENCOUNTER — Encounter: Payer: Self-pay | Admitting: Allergy

## 2022-05-23 ENCOUNTER — Ambulatory Visit: Payer: Self-pay | Admitting: Allergy

## 2022-05-23 VITALS — BP 112/60 | HR 89 | Resp 16 | Ht 60.0 in | Wt 243.6 lb

## 2022-05-23 DIAGNOSIS — T7800XD Anaphylactic reaction due to unspecified food, subsequent encounter: Secondary | ICD-10-CM

## 2022-05-23 DIAGNOSIS — T7800XA Anaphylactic reaction due to unspecified food, initial encounter: Secondary | ICD-10-CM

## 2022-05-23 MED ORDER — EPINEPHRINE 0.3 MG/0.3ML IJ SOAJ: 0.3000 mg | INTRAMUSCULAR | 1 refills | Status: AC | PRN

## 2022-05-23 NOTE — Patient Instructions (Addendum)
Food allergy    - skin testing today is positive to peanut, tree nuts (walnuts, almonds, hazelnut, pistachio) and cabbage (broccoli belongs to cabbage family).   Testing to tomato is negative.     - continue avoidance of all nuts and cabbage related foods    - have access to self-injectable epinephrine (Epipen) 0.3mg  at all times    - follow emergency action plan in case of allergic reaction   Follow-up in 1 year or sooner if needed
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
